# Patient Record
Sex: Male | Born: 1950 | Race: White | Hispanic: No | Marital: Married | Smoking: Former smoker
Health system: Southern US, Community
[De-identification: ages and names within clinical notes are randomized; demographics above are authoritative.]

## PROBLEM LIST (undated history)

## (undated) DIAGNOSIS — I272 Pulmonary hypertension, unspecified: Secondary | ICD-10-CM

## (undated) DIAGNOSIS — Z95810 Presence of automatic (implantable) cardiac defibrillator: Secondary | ICD-10-CM

## (undated) DIAGNOSIS — H348322 Tributary (branch) retinal vein occlusion, left eye, stable: Secondary | ICD-10-CM

## (undated) DIAGNOSIS — N138 Other obstructive and reflux uropathy: Secondary | ICD-10-CM

## (undated) DIAGNOSIS — G8929 Other chronic pain: Secondary | ICD-10-CM

## (undated) DIAGNOSIS — I2510 Atherosclerotic heart disease of native coronary artery without angina pectoris: Secondary | ICD-10-CM

## (undated) DIAGNOSIS — M549 Dorsalgia, unspecified: Principal | ICD-10-CM

## (undated) DIAGNOSIS — N401 Enlarged prostate with lower urinary tract symptoms: Secondary | ICD-10-CM

## (undated) DIAGNOSIS — G4733 Obstructive sleep apnea (adult) (pediatric): Secondary | ICD-10-CM

## (undated) DIAGNOSIS — I4510 Unspecified right bundle-branch block: Secondary | ICD-10-CM

## (undated) DIAGNOSIS — I10 Essential (primary) hypertension: Secondary | ICD-10-CM

## (undated) DIAGNOSIS — Z950 Presence of cardiac pacemaker: Secondary | ICD-10-CM

## (undated) DIAGNOSIS — E785 Hyperlipidemia, unspecified: Secondary | ICD-10-CM

## (undated) DIAGNOSIS — G629 Polyneuropathy, unspecified: Secondary | ICD-10-CM

## (undated) DIAGNOSIS — I5020 Unspecified systolic (congestive) heart failure: Secondary | ICD-10-CM

## (undated) DIAGNOSIS — G2581 Restless legs syndrome: Secondary | ICD-10-CM

## (undated) DIAGNOSIS — F17200 Nicotine dependence, unspecified, uncomplicated: Secondary | ICD-10-CM

## (undated) DIAGNOSIS — G4700 Insomnia, unspecified: Secondary | ICD-10-CM

## (undated) DIAGNOSIS — D62 Acute posthemorrhagic anemia: Secondary | ICD-10-CM

## (undated) DIAGNOSIS — Z8669 Personal history of other diseases of the nervous system and sense organs: Secondary | ICD-10-CM

## (undated) DIAGNOSIS — I480 Paroxysmal atrial fibrillation: Secondary | ICD-10-CM

## (undated) DIAGNOSIS — K922 Gastrointestinal hemorrhage, unspecified: Secondary | ICD-10-CM

## (undated) DIAGNOSIS — K279 Peptic ulcer, site unspecified, unspecified as acute or chronic, without hemorrhage or perforation: Secondary | ICD-10-CM

## (undated) DIAGNOSIS — F1190 Opioid use, unspecified, uncomplicated: Secondary | ICD-10-CM

## (undated) HISTORY — DX: Opioid use, unspecified, uncomplicated: F11.90

## (undated) HISTORY — DX: Other chronic pain: G89.29

## (undated) HISTORY — DX: Restless legs syndrome: G25.81

## (undated) HISTORY — DX: Personal history of other diseases of the nervous system and sense organs: Z86.69

## (undated) HISTORY — DX: Obstructive sleep apnea (adult) (pediatric): G47.33

## (undated) HISTORY — DX: Insomnia, unspecified: G47.00

## (undated) HISTORY — DX: Other obstructive and reflux uropathy: N40.1

## (undated) HISTORY — DX: Tributary (branch) retinal vein occlusion, left eye, stable: H34.8322

## (undated) HISTORY — DX: Atherosclerotic heart disease of native coronary artery without angina pectoris: I25.10

## (undated) HISTORY — DX: Gastrointestinal hemorrhage, unspecified: K92.2

## (undated) HISTORY — DX: Nicotine dependence, unspecified, uncomplicated: F17.200

## (undated) HISTORY — DX: Unspecified systolic (congestive) heart failure: I50.20

## (undated) HISTORY — DX: Essential (primary) hypertension: I10

## (undated) HISTORY — DX: Presence of automatic (implantable) cardiac defibrillator: Z95.810

## (undated) HISTORY — PX: TONSILLECTOMY: SUR1361

## (undated) HISTORY — DX: Polyneuropathy, unspecified: G62.9

## (undated) HISTORY — DX: Presence of cardiac pacemaker: Z95.0

## (undated) HISTORY — DX: Pulmonary hypertension, unspecified: I27.20

## (undated) HISTORY — DX: Acute posthemorrhagic anemia: D62

## (undated) HISTORY — DX: Unspecified right bundle-branch block: I45.10

## (undated) HISTORY — DX: Paroxysmal atrial fibrillation: I48.0

## (undated) HISTORY — DX: Dorsalgia, unspecified: M54.9

## (undated) HISTORY — DX: Peptic ulcer, site unspecified, unspecified as acute or chronic, without hemorrhage or perforation: K27.9

## (undated) HISTORY — DX: Benign prostatic hyperplasia with lower urinary tract symptoms: N13.8

## (undated) HISTORY — DX: Hyperlipidemia, unspecified: E78.5

---

## 1999-04-02 ENCOUNTER — Ambulatory Visit (HOSPITAL_COMMUNITY): Admission: RE | Admit: 1999-04-02 | Discharge: 1999-04-02 | Payer: Self-pay | Admitting: Podiatry

## 1999-04-02 ENCOUNTER — Encounter: Payer: Self-pay | Admitting: Podiatry

## 2000-04-08 ENCOUNTER — Ambulatory Visit (HOSPITAL_COMMUNITY): Admission: RE | Admit: 2000-04-08 | Discharge: 2000-04-08 | Payer: Self-pay | Admitting: Orthopedic Surgery

## 2000-04-09 ENCOUNTER — Encounter: Payer: Self-pay | Admitting: Orthopedic Surgery

## 2000-06-15 DIAGNOSIS — I4510 Unspecified right bundle-branch block: Secondary | ICD-10-CM

## 2000-06-15 HISTORY — PX: BACK SURGERY: SHX140

## 2000-06-15 HISTORY — PX: CARDIOVASCULAR STRESS TEST: SHX262

## 2000-06-15 HISTORY — DX: Unspecified right bundle-branch block: I45.10

## 2001-01-13 ENCOUNTER — Encounter: Admission: RE | Admit: 2001-01-13 | Discharge: 2001-01-13 | Payer: Self-pay | Admitting: Surgery

## 2001-01-13 ENCOUNTER — Encounter: Payer: Self-pay | Admitting: Surgery

## 2001-01-14 ENCOUNTER — Ambulatory Visit (HOSPITAL_BASED_OUTPATIENT_CLINIC_OR_DEPARTMENT_OTHER): Admission: RE | Admit: 2001-01-14 | Discharge: 2001-01-14 | Payer: Self-pay | Admitting: Surgery

## 2001-04-15 ENCOUNTER — Encounter: Payer: Self-pay | Admitting: Specialist

## 2001-04-15 ENCOUNTER — Encounter: Admission: RE | Admit: 2001-04-15 | Discharge: 2001-04-15 | Payer: Self-pay | Admitting: Specialist

## 2001-06-29 ENCOUNTER — Inpatient Hospital Stay (HOSPITAL_COMMUNITY): Admission: RE | Admit: 2001-06-29 | Discharge: 2001-07-03 | Payer: Self-pay | Admitting: Specialist

## 2001-06-29 ENCOUNTER — Encounter: Payer: Self-pay | Admitting: Specialist

## 2003-03-19 ENCOUNTER — Encounter: Payer: Self-pay | Admitting: Surgery

## 2003-03-19 ENCOUNTER — Encounter: Admission: RE | Admit: 2003-03-19 | Discharge: 2003-03-19 | Payer: Self-pay | Admitting: Surgery

## 2003-03-20 ENCOUNTER — Ambulatory Visit (HOSPITAL_BASED_OUTPATIENT_CLINIC_OR_DEPARTMENT_OTHER): Admission: RE | Admit: 2003-03-20 | Discharge: 2003-03-20 | Payer: Self-pay | Admitting: Surgery

## 2003-03-20 ENCOUNTER — Ambulatory Visit (HOSPITAL_COMMUNITY): Admission: RE | Admit: 2003-03-20 | Discharge: 2003-03-20 | Payer: Self-pay | Admitting: Surgery

## 2003-06-16 HISTORY — PX: HERNIA REPAIR: SHX51

## 2004-05-26 ENCOUNTER — Ambulatory Visit: Payer: Self-pay | Admitting: Family Medicine

## 2004-11-25 ENCOUNTER — Ambulatory Visit: Payer: Self-pay | Admitting: Family Medicine

## 2005-05-26 ENCOUNTER — Ambulatory Visit: Payer: Self-pay | Admitting: Family Medicine

## 2005-07-03 ENCOUNTER — Ambulatory Visit: Payer: Self-pay | Admitting: Family Medicine

## 2005-11-23 ENCOUNTER — Ambulatory Visit: Payer: Self-pay | Admitting: Family Medicine

## 2006-06-15 HISTORY — PX: KNEE SURGERY: SHX244

## 2011-03-24 LAB — COMPREHENSIVE METABOLIC PANEL
AST: 18 U/L
Albumin: 4.4
BUN: 9 mg/dL (ref 4–21)
Calcium: 9.5 mg/dL
Creat: 0.7
Total Protein: 6.7 g/dL

## 2011-03-24 LAB — LIPID PANEL
Cholesterol, Total: 196
Triglycerides: 253.7

## 2011-03-24 LAB — CBC
Hemoglobin: 18 g/dL — AB (ref 13.5–17.5)
platelet count: 193

## 2011-08-24 ENCOUNTER — Encounter: Payer: Self-pay | Admitting: Family Medicine

## 2011-08-24 ENCOUNTER — Ambulatory Visit (INDEPENDENT_AMBULATORY_CARE_PROVIDER_SITE_OTHER): Payer: Medicare Other | Admitting: Family Medicine

## 2011-08-24 VITALS — BP 142/84 | HR 80 | Temp 97.9°F | Ht 68.0 in | Wt 289.8 lb

## 2011-08-24 DIAGNOSIS — G2581 Restless legs syndrome: Secondary | ICD-10-CM

## 2011-08-24 DIAGNOSIS — N138 Other obstructive and reflux uropathy: Secondary | ICD-10-CM

## 2011-08-24 DIAGNOSIS — Z8679 Personal history of other diseases of the circulatory system: Secondary | ICD-10-CM

## 2011-08-24 DIAGNOSIS — G8929 Other chronic pain: Secondary | ICD-10-CM

## 2011-08-24 DIAGNOSIS — M549 Dorsalgia, unspecified: Secondary | ICD-10-CM

## 2011-08-24 DIAGNOSIS — G4733 Obstructive sleep apnea (adult) (pediatric): Secondary | ICD-10-CM

## 2011-08-24 DIAGNOSIS — N139 Obstructive and reflux uropathy, unspecified: Secondary | ICD-10-CM

## 2011-08-24 DIAGNOSIS — E785 Hyperlipidemia, unspecified: Secondary | ICD-10-CM

## 2011-08-24 DIAGNOSIS — N401 Enlarged prostate with lower urinary tract symptoms: Secondary | ICD-10-CM

## 2011-08-24 DIAGNOSIS — G4700 Insomnia, unspecified: Secondary | ICD-10-CM

## 2011-08-24 DIAGNOSIS — G629 Polyneuropathy, unspecified: Secondary | ICD-10-CM

## 2011-08-24 DIAGNOSIS — I10 Essential (primary) hypertension: Secondary | ICD-10-CM

## 2011-08-24 MED ORDER — MORPHINE SULFATE CR 60 MG PO TB12: 60.0000 mg | ORAL_TABLET | Freq: Two times a day (BID) | ORAL | Status: AC

## 2011-08-24 MED ORDER — MORPHINE SULFATE CR 60 MG PO TB12: 60.0000 mg | ORAL_TABLET | Freq: Two times a day (BID) | ORAL | Status: DC

## 2011-08-24 NOTE — Progress Notes (Signed)
Subjective:    Patient ID: Patrick Garner, male    DOB: 1951/05/15, 61 y.o.   MRN: [id number]  HPI CC: new pt establish  Prior saw Dr. Hetty Ely then Dr. Royal Hawthorn then moved back here so would like to re establish.  Lives in [place].  Commutes here for medicines.  States no doc in [place] is willing to prescribe morphine.  No questions or concerns today. Presents to get established.  H/o chronic back pain - since back surgery, on disability for this.  takes morphine long acting bid.  Takes oxycodone for breakthrough pain.  Rarely uses oxycodone 10mg .  Requests 3 mo at a time.  Verified med dosing based on records from Dr. Royal Hawthorn.  H/o BPH - takes finasteride and flomax.  Feels stable.  Preventative: Last CPE was late last year 03/24/2011. Tetanus 2008. No flu shot this year. No colonoscopy.  Medications and allergies reviewed and updated in chart.  Past histories reviewed and updated if relevant as below. There is no problem list on file for this patient.  Past Medical History  Diagnosis Date  . HTN (hypertension)   . HLD (hyperlipidemia)   . Allergic rhinitis   . Chronic back pain     h/o spinal fusion with rods/screw, on morphine/gabapentin/oxycodone  . RLS (restless legs syndrome)   . Peripheral neuropathy   . OSA (obstructive sleep apnea)     refuses sleep study  . Lateral knee pain   . Insomnia   . BPH (benign prostatic hypertrophy) with urinary obstruction   . History of Bell's palsy   . Chronic narcotic use   . History of coronary artery disease     s/p MI remotely   Past Surgical History  Procedure Date  . Knee surgery 2008  . Back surgery 2002  . Hernia repair 2005  . Tonsillectomy 1950's   History  Substance Use Topics  . Smoking status: Current Everyday Smoker -- 0.8 packs/day    Types: Cigarettes  . Smokeless tobacco: Never Used  . Alcohol Use: No     Rare   Family History  Problem Relation Age of Onset  . Heart disease Father   . Diabetes Paternal  Aunt   . Diabetes Paternal Uncle   . Coronary artery disease Neg Hx   . Stroke Neg Hx   . Cancer Neg Hx    No Known Allergies No current outpatient prescriptions on file prior to visit.     Review of Systems  Constitutional: Negative for fever, chills, activity change, appetite change, fatigue and unexpected weight change.  HENT: Negative for hearing loss and neck pain.   Eyes: Negative for visual disturbance.  Respiratory: Negative for cough, chest tightness, shortness of breath and wheezing.   Cardiovascular: Negative for chest pain, palpitations and leg swelling.  Gastrointestinal: Negative for nausea, vomiting, abdominal pain, diarrhea, constipation, blood in stool and abdominal distention.  Genitourinary: Negative for hematuria and difficulty urinating.  Musculoskeletal: Negative for myalgias and arthralgias.  Skin: Negative for rash.  Neurological: Negative for dizziness, seizures, syncope and headaches.  Hematological: Does not bruise/bleed easily.  Psychiatric/Behavioral: Negative for dysphoric mood. The patient is not nervous/anxious.        Objective:   Physical Exam  Nursing note and vitals reviewed. Constitutional: He is oriented to person, place, and time. He appears well-developed and well-nourished. No distress.       obese  HENT:  Head: Normocephalic and atraumatic.  Right Ear: External ear normal.  Left Ear: External  ear normal.  Nose: Nose normal.  Mouth/Throat: Oropharynx is clear and moist. No oropharyngeal exudate.  Eyes: Conjunctivae and EOM are normal. Pupils are equal, round, and reactive to light. No scleral icterus.  Neck: Normal range of motion. Neck supple. Carotid bruit is not present. No thyromegaly present.  Cardiovascular: Normal rate, regular rhythm, normal heart sounds and intact distal pulses.   No murmur heard. Pulses:      Radial pulses are 2+ on the right side, and 2+ on the left side.  Pulmonary/Chest: Effort normal and breath sounds  normal. No respiratory distress. He has no wheezes. He has no rales.  Abdominal: Soft. Bowel sounds are normal. He exhibits no distension and no mass. There is no tenderness. There is no rebound and no guarding.  Musculoskeletal: Normal range of motion. He exhibits edema (tr pedal edema).  Lymphadenopathy:    He has no cervical adenopathy.  Neurological: He is alert and oriented to person, place, and time.       CN grossly intact, station and gait intact  Skin: Skin is warm and dry. No rash noted.  Psychiatric: He has a normal mood and affect. His behavior is normal. Judgment and thought content normal.       Assessment & Plan:

## 2011-08-24 NOTE — Patient Instructions (Signed)
Good to see you today, return in 3 months for follow up I have provided you with 3 month supply of morphine.  Call us when other refills are due. Sign controlled substance agreement. Call us with questions in the meantime.

## 2011-08-25 ENCOUNTER — Encounter: Payer: Self-pay | Admitting: Family Medicine

## 2011-08-25 DIAGNOSIS — G4700 Insomnia, unspecified: Secondary | ICD-10-CM | POA: Insufficient documentation

## 2011-08-25 DIAGNOSIS — I10 Essential (primary) hypertension: Secondary | ICD-10-CM | POA: Insufficient documentation

## 2011-08-25 DIAGNOSIS — G8929 Other chronic pain: Secondary | ICD-10-CM | POA: Insufficient documentation

## 2011-08-25 DIAGNOSIS — G4733 Obstructive sleep apnea (adult) (pediatric): Secondary | ICD-10-CM | POA: Insufficient documentation

## 2011-08-25 DIAGNOSIS — N401 Enlarged prostate with lower urinary tract symptoms: Secondary | ICD-10-CM | POA: Insufficient documentation

## 2011-08-25 DIAGNOSIS — G629 Polyneuropathy, unspecified: Secondary | ICD-10-CM | POA: Insufficient documentation

## 2011-08-25 DIAGNOSIS — N138 Other obstructive and reflux uropathy: Secondary | ICD-10-CM | POA: Insufficient documentation

## 2011-08-25 DIAGNOSIS — E785 Hyperlipidemia, unspecified: Secondary | ICD-10-CM | POA: Insufficient documentation

## 2011-08-25 DIAGNOSIS — G2581 Restless legs syndrome: Secondary | ICD-10-CM | POA: Insufficient documentation

## 2011-08-25 NOTE — Assessment & Plan Note (Signed)
Stable, chronic. Continue finasteride and flomax.

## 2011-08-25 NOTE — Assessment & Plan Note (Signed)
BP: 142/84 mmHg  Stable. Continue meds for now.

## 2011-08-25 NOTE — Assessment & Plan Note (Signed)
Have asked to scan chol into chart.

## 2011-08-25 NOTE — Assessment & Plan Note (Signed)
Verified med dosing.  Refilled x 3 mo at a time as commutes to [facility]. Anticipate healthiest thing down road is for pt to establish with provider locally. Pt signed controlled substance agreement.

## 2011-08-25 NOTE — Assessment & Plan Note (Signed)
Pt declines to pursue further evaluation.

## 2011-10-08 ENCOUNTER — Other Ambulatory Visit: Payer: Self-pay

## 2011-10-08 MED ORDER — FINASTERIDE 5 MG PO TABS: 5.0000 mg | ORAL_TABLET | Freq: Every day | ORAL | Status: DC

## 2011-10-08 MED ORDER — OXYCODONE HCL 10 MG PO TABS: 10.0000 mg | ORAL_TABLET | Freq: Two times a day (BID) | ORAL | Status: DC | PRN

## 2011-10-08 MED ORDER — ATORVASTATIN CALCIUM 40 MG PO TABS: 40.0000 mg | ORAL_TABLET | Freq: Every day | ORAL | Status: DC

## 2011-10-08 NOTE — Telephone Encounter (Signed)
plz notify ready for pick up. 

## 2011-10-08 NOTE — Telephone Encounter (Signed)
Pt request written rxs for Atorvastatin 40 mg, finasteride 5 mg and oxycodone 10mg . Pt will be coming to our area from [place] on 10/09/11. Pt last seen 08/24/11. Pt has appt scheduled with Dr Reece Agar 11/24/11.Please call pt [phone] when rx ready for pick up. Pt said would like call back today to make sure rx will be ready on Frid. Told pt nurse would call when ready for pick up and pt said oK.

## 2011-10-09 NOTE — Telephone Encounter (Signed)
Patient aware and has already picked up Rx's.

## 2011-11-24 ENCOUNTER — Encounter: Payer: Self-pay | Admitting: Family Medicine

## 2011-11-24 ENCOUNTER — Ambulatory Visit (INDEPENDENT_AMBULATORY_CARE_PROVIDER_SITE_OTHER): Payer: Medicare Other | Admitting: Family Medicine

## 2011-11-24 VITALS — BP 130/88 | HR 80 | Temp 97.9°F | Ht 68.0 in | Wt 290.5 lb

## 2011-11-24 DIAGNOSIS — M549 Dorsalgia, unspecified: Secondary | ICD-10-CM

## 2011-11-24 DIAGNOSIS — Z87891 Personal history of nicotine dependence: Secondary | ICD-10-CM | POA: Insufficient documentation

## 2011-11-24 DIAGNOSIS — E785 Hyperlipidemia, unspecified: Secondary | ICD-10-CM

## 2011-11-24 DIAGNOSIS — I10 Essential (primary) hypertension: Secondary | ICD-10-CM

## 2011-11-24 DIAGNOSIS — F17200 Nicotine dependence, unspecified, uncomplicated: Secondary | ICD-10-CM

## 2011-11-24 DIAGNOSIS — Z720 Tobacco use: Secondary | ICD-10-CM

## 2011-11-24 DIAGNOSIS — G8929 Other chronic pain: Secondary | ICD-10-CM

## 2011-11-24 LAB — COMPREHENSIVE METABOLIC PANEL
AST: 17 U/L (ref 0–37)
Albumin: 3.8 g/dL (ref 3.5–5.2)
Alkaline Phosphatase: 104 U/L (ref 39–117)
Potassium: 4.2 mEq/L (ref 3.5–5.1)
Sodium: 145 mEq/L (ref 135–145)
Total Protein: 6 g/dL (ref 6.0–8.3)

## 2011-11-24 LAB — LIPID PANEL
LDL Cholesterol: 60 mg/dL (ref 0–99)
VLDL: 22.6 mg/dL (ref 0.0–40.0)

## 2011-11-24 MED ORDER — OXYCODONE HCL 10 MG PO TABS: 10.0000 mg | ORAL_TABLET | Freq: Two times a day (BID) | ORAL | Status: DC | PRN

## 2011-11-24 MED ORDER — MORPHINE SULFATE ER 60 MG PO TBCR: 60.0000 mg | EXTENDED_RELEASE_TABLET | Freq: Two times a day (BID) | ORAL | Status: DC

## 2011-11-24 MED ORDER — OXYCODONE HCL 10 MG PO TABS: 10.0000 mg | ORAL_TABLET | Freq: Two times a day (BID) | ORAL | Status: DC

## 2011-11-24 NOTE — Assessment & Plan Note (Signed)
Contemplative.  Using e-cig. Encouraged cessation.

## 2011-11-24 NOTE — Progress Notes (Signed)
Subjective:    Patient ID: Patrick Garner, male    DOB: 07/15/1950, 61 y.o.   MRN: [id number]  HPI CC: 3 mo f/u  chronic back pain - takes MS contin 60mg  bid as well as oxycodone for breakthrough pain.  During summer, takes about 4-5 pills monthly.  Open to decreasing dose of pain meds.  HTN - No vision changes, CP/tightness, SOB, leg swelling.  Last vision screen was 5 yrs ago.  Due for this.  Usually left leg swells more than right.  HLD - tolerant of lipitor.  No myalgias.  Smoking - <1 ppd.  Bought e-cigarette.  Contemplative.  Fasting today.  No recent tetanus.  Possibly 6 yrs ago.  May give at physical.  Past Medical History  Diagnosis Date  . HTN (hypertension)   . HLD (hyperlipidemia)   . Allergic rhinitis   . Chronic back pain     h/o spinal fusion with rods/screw, on morphine/gabapentin/oxycodone  . RLS (restless legs syndrome)   . Peripheral neuropathy   . OSA (obstructive sleep apnea)     refuses sleep eval  . Lateral knee pain   . Insomnia   . BPH (benign prostatic hypertrophy) with urinary obstruction   . History of Bell's palsy   . Chronic narcotic use   . History of coronary artery disease     s/p MI remotely  . Smoker   . PUD (peptic ulcer disease) 1980s    via EGD  . RBBB    Current Outpatient Prescriptions on File Prior to Visit  Medication Sig Dispense Refill  . aspirin 325 MG tablet Take 325 mg by mouth daily.      Marland Kitchen atorvastatin (LIPITOR) 40 MG tablet Take 1 tablet (40 mg total) by mouth at bedtime.  90 tablet  3  . finasteride (PROSCAR) 5 MG tablet Take 1 tablet (5 mg total) by mouth daily.  90 tablet  3  . fish oil-omega-3 fatty acids 1000 MG capsule Take 1 g by mouth 3 (three) times daily.      . fluticasone (FLONASE) 50 MCG/ACT nasal spray Place 2 sprays into the nose daily.      Marland Kitchen gabapentin (NEURONTIN) 800 MG tablet Take 800 mg by mouth 3 (three) times daily.      Marland Kitchen lisinopril (PRINIVIL,ZESTRIL) 40 MG tablet Take 40 mg by mouth daily.       . potassium chloride SA (K-DUR,KLOR-CON) 20 MEQ tablet Take 20 mEq by mouth daily.       . Tamsulosin HCl (FLOMAX) 0.4 MG CAPS Take 0.4 mg by mouth daily.      Marland Kitchen zolpidem (AMBIEN) 10 MG tablet Take 20 mg by mouth at bedtime as needed.        Review of Systems Per HPI    Objective:   Physical Exam  Nursing note and vitals reviewed. Constitutional: He appears well-developed and well-nourished. No distress.       obese  HENT:  Head: Normocephalic and atraumatic.  Mouth/Throat: Oropharynx is clear and moist. No oropharyngeal exudate.  Eyes: Conjunctivae and EOM are normal. Pupils are equal, round, and reactive to light. No scleral icterus.  Neck: Normal range of motion. Neck supple. Carotid bruit is not present.  Cardiovascular: Normal rate, regular rhythm, normal heart sounds and intact distal pulses.   No murmur heard. Pulmonary/Chest: Effort normal and breath sounds normal. No respiratory distress. He has no wheezes. He has no rales.  Musculoskeletal: He exhibits no edema.  Skin: Skin is warm and  dry. No rash noted.  Psychiatric: He has a normal mood and affect.       Assessment & Plan:

## 2011-11-24 NOTE — Assessment & Plan Note (Signed)
Refilled MS Contin and oxycodone for 3 mo supply. Only uses 4-5 oxycodone pills a month recently, decreased # from 60 to 30. Discussed decreasing dose of MS contin - to minimize narcotics - pt open to this, will try next visit. Has signed controlled substance agreement.

## 2011-11-24 NOTE — Assessment & Plan Note (Signed)
BP Readings from Last 3 Encounters:  11/24/11 130/88  08/24/11 142/84  chronic, stable.  Continue med.

## 2011-11-24 NOTE — Patient Instructions (Signed)
Blood work today. Return in 3 months for follow up.  We may try and back down on morphine at that visit. Good to see you today, call us with questions.

## 2011-11-24 NOTE — Assessment & Plan Note (Signed)
Tolerating statin. Check FLP today. 

## 2012-02-24 ENCOUNTER — Encounter: Payer: Self-pay | Admitting: Family Medicine

## 2012-02-24 ENCOUNTER — Ambulatory Visit (INDEPENDENT_AMBULATORY_CARE_PROVIDER_SITE_OTHER): Payer: Medicare Other | Admitting: Family Medicine

## 2012-02-24 VITALS — BP 134/88 | HR 64 | Temp 97.5°F | Wt 283.0 lb

## 2012-02-24 DIAGNOSIS — M549 Dorsalgia, unspecified: Secondary | ICD-10-CM

## 2012-02-24 DIAGNOSIS — Z23 Encounter for immunization: Secondary | ICD-10-CM

## 2012-02-24 DIAGNOSIS — G8929 Other chronic pain: Secondary | ICD-10-CM

## 2012-02-24 MED ORDER — ZOLPIDEM TARTRATE 10 MG PO TABS: 20.0000 mg | ORAL_TABLET | Freq: Every evening | ORAL | Status: DC | PRN

## 2012-02-24 MED ORDER — IBUPROFEN 600 MG PO TABS: 600.0000 mg | ORAL_TABLET | Freq: Two times a day (BID) | ORAL | Status: DC | PRN

## 2012-02-24 MED ORDER — FINASTERIDE 5 MG PO TABS: 5.0000 mg | ORAL_TABLET | Freq: Every day | ORAL | Status: DC

## 2012-02-24 MED ORDER — TRAMADOL HCL 50 MG PO TABS: 50.0000 mg | ORAL_TABLET | Freq: Two times a day (BID) | ORAL | Status: DC | PRN

## 2012-02-24 MED ORDER — MORPHINE SULFATE ER 30 MG PO TBCR: 30.0000 mg | EXTENDED_RELEASE_TABLET | Freq: Two times a day (BID) | ORAL | Status: DC

## 2012-02-24 MED ORDER — FLUTICASONE PROPIONATE 50 MCG/ACT NA SUSP: 2.0000 | Freq: Every day | NASAL | Status: DC

## 2012-02-24 MED ORDER — TAMSULOSIN HCL 0.4 MG PO CAPS: 0.4000 mg | ORAL_CAPSULE | Freq: Every day | ORAL | Status: DC

## 2012-02-24 MED ORDER — LISINOPRIL 40 MG PO TABS: 40.0000 mg | ORAL_TABLET | Freq: Every day | ORAL | Status: DC

## 2012-02-24 MED ORDER — GABAPENTIN 800 MG PO TABS: 800.0000 mg | ORAL_TABLET | Freq: Three times a day (TID) | ORAL | Status: DC

## 2012-02-24 MED ORDER — TRAMADOL HCL 50 MG PO TABS: 50.0000 mg | ORAL_TABLET | Freq: Two times a day (BID) | ORAL | Status: AC | PRN

## 2012-02-24 NOTE — Assessment & Plan Note (Addendum)
Off oxycodone. Discussed decreasing MS contin - pt hopeful to be able to minimize use of narcotics. Goal for next visit will be decreased MS contin use by 50%.   Start by taking 60mg  in am and 30mg  in pm for 1 month, then decrease to 30mg  bid. Prescribed tramadol for interim pain and ibuprofen for breakthrough pain despite tramadol. Discussed sxs of narcotic withdrawal and to notify me if any evidence of this. If not doing well with this, discussed need to return sooner, pt states would be able to do this. Pt tends to do well in summer, worse pain in winter - so will need to closely monitor this. discussed precautions with use of ibuprofen and h/o CVD, h/o PUD.  No known CKD.  Lab Results  Component Value Date   CREATININE 0.9 11/24/2011

## 2012-02-24 NOTE — Patient Instructions (Addendum)
I'm glad we were able to stop oxycodone. Let's continue MS contin but at lower dose - take 60mg  in morning and 30mg  in evening for 1 month.  Then may decrease to 30mg  twice daily May take tramadol twice daily as needed for pain - script with 3 refills printed out. May take ibuprofen for breakthrough pain, but try to minimize given it can affect heart, kidneys and stomach. Good to see you today, return in 3 months for medicare physical.

## 2012-02-24 NOTE — Progress Notes (Signed)
Subjective:    Patient ID: Patrick Garner, male    DOB: 06/06/1951, 61 y.o.   MRN: [id number]  HPI CC: f/u pain meds.  Was scheduled for medicare wellness form, but actually not due until next month.  Will return for this at next visit in december.  H/o chronic back pain s/p surgery - takes MS contin 60mg  bid as well as was taking oxycodone for breakthrough pain.  Actually weaned himself off oxycodone - changed to ibuprofen, taking 800mg  prn once or twice a day, when in pain.  Finds that ibupofen works just as well.  Very open to decreasing MS Contin dose.    Has been compliant with narcotics, has not requested early refills.  Initially was on oxycodone, but this was changed by prior provider to morphine 2/2 cheaper cost.  O/w doing well.  Tolerating all his meds.  Requests refills which have been provided.  Requests handicap placard renewal, uses cane, cannot walk prolonged periods due to orthopedic condition.  Medications and allergies reviewed and updated in chart.  Past histories reviewed and updated if relevant as below. Patient Active Problem List  Diagnosis  . HTN (hypertension)  . HLD (hyperlipidemia)  . Chronic back pain  . RLS (restless legs syndrome)  . Peripheral neuropathy  . OSA (obstructive sleep apnea)  . Insomnia  . BPH (benign prostatic hypertrophy) with urinary obstruction  . History of coronary artery disease  . Tobacco abuse   Past Medical History  Diagnosis Date  . HTN (hypertension)   . HLD (hyperlipidemia)   . Allergic rhinitis   . Chronic back pain     h/o spinal fusion with rods/screw, on morphine/gabapentin/oxycodone  . RLS (restless legs syndrome)   . Peripheral neuropathy   . OSA (obstructive sleep apnea)     refuses sleep eval  . Lateral knee pain   . Insomnia   . BPH (benign prostatic hypertrophy) with urinary obstruction   . History of Bell's palsy   . Chronic narcotic use   . History of coronary artery disease     s/p MI remotely    . Smoker   . PUD (peptic ulcer disease) 1980s    via EGD  . RBBB    Past Surgical History  Procedure Date  . Knee surgery 2008  . Back surgery 2002  . Hernia repair 2005  . Tonsillectomy 1950's  . 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 Smoking status: Current Every Day Smoker -- 0.8 packs/day    Types: Cigarettes  . Smokeless tobacco: Never Used  . Alcohol Use: No   Family History  Problem Relation Age of Onset  . Heart disease Father   . Diabetes Paternal Aunt   . Diabetes Paternal Uncle   . Coronary artery disease Neg Hx   . Stroke Neg Hx   . Cancer Neg Hx    No Known Allergies Current Outpatient Prescriptions on File Prior to Visit  Medication Sig Dispense Refill  . aspirin 325 MG tablet Take 325 mg by mouth daily.      Marland Kitchen atorvastatin (LIPITOR) 40 MG tablet Take 1 tablet (40 mg total) by mouth at bedtime.  90 tablet  3  . finasteride (PROSCAR) 5 MG tablet Take 1 tablet (5 mg total) by mouth daily.  90 tablet  3  . fish oil-omega-3 fatty acids 1000 MG capsule Take 1 g by mouth 3 (  three) times daily.      . fluticasone (FLONASE) 50 MCG/ACT nasal spray Place 2 sprays into the nose daily.      Marland Kitchen gabapentin (NEURONTIN) 800 MG tablet Take 800 mg by mouth 3 (three) times daily.      Marland Kitchen lisinopril (PRINIVIL,ZESTRIL) 40 MG tablet Take 40 mg by mouth daily.      Marland Kitchen morphine (MS CONTIN) 60 MG 12 hr tablet Take 60 mg by mouth 2 (two) times daily. Fill after 02/07/2012      . potassium chloride SA (K-DUR,KLOR-CON) 20 MEQ tablet Take 20 mEq by mouth daily.       . Tamsulosin HCl (FLOMAX) 0.4 MG CAPS Take 0.4 mg by mouth daily.      Marland Kitchen zolpidem (AMBIEN) 10 MG tablet Take 20 mg by mouth at bedtime as needed.         Review of Systems Per HPI    Objective:   Physical Exam  Nursing note and vitals reviewed. Constitutional: He appears well-developed and well-nourished. No distress.        obese  HENT:  Head: Normocephalic and atraumatic.  Mouth/Throat: Oropharynx is clear and moist. No oropharyngeal exudate.  Eyes: Conjunctivae normal and EOM are normal. Pupils are equal, round, and reactive to light. No scleral icterus.       Small pupils  Neck: Normal range of motion. Neck supple. Carotid bruit is not present.  Cardiovascular: Normal rate, regular rhythm, normal heart sounds and intact distal pulses.   No murmur heard. Pulmonary/Chest: Effort normal and breath sounds normal. No respiratory distress. He has no wheezes. He has no rales.  Musculoskeletal: He exhibits no edema.  Skin: Skin is warm and dry. No rash noted.  Psychiatric: He has a normal mood and affect.       Assessment & Plan:  Flu shot today. Asked him to bring medicare wellness visit questionairre that he has already filled out to next visit.

## 2012-03-07 ENCOUNTER — Encounter: Payer: Self-pay | Admitting: Family Medicine

## 2012-05-24 ENCOUNTER — Encounter: Payer: Self-pay | Admitting: Family Medicine

## 2012-05-24 ENCOUNTER — Encounter: Payer: Self-pay | Admitting: *Deleted

## 2012-05-24 ENCOUNTER — Ambulatory Visit (INDEPENDENT_AMBULATORY_CARE_PROVIDER_SITE_OTHER): Payer: Medicare HMO | Admitting: Family Medicine

## 2012-05-24 VITALS — BP 136/96 | HR 84 | Temp 98.1°F | Ht 69.0 in | Wt 286.0 lb

## 2012-05-24 DIAGNOSIS — E785 Hyperlipidemia, unspecified: Secondary | ICD-10-CM

## 2012-05-24 DIAGNOSIS — Z1211 Encounter for screening for malignant neoplasm of colon: Secondary | ICD-10-CM

## 2012-05-24 DIAGNOSIS — N139 Obstructive and reflux uropathy, unspecified: Secondary | ICD-10-CM

## 2012-05-24 DIAGNOSIS — N138 Other obstructive and reflux uropathy: Secondary | ICD-10-CM

## 2012-05-24 DIAGNOSIS — N401 Enlarged prostate with lower urinary tract symptoms: Secondary | ICD-10-CM

## 2012-05-24 DIAGNOSIS — Z1331 Encounter for screening for depression: Secondary | ICD-10-CM

## 2012-05-24 DIAGNOSIS — Z23 Encounter for immunization: Secondary | ICD-10-CM

## 2012-05-24 DIAGNOSIS — Z0000 Encounter for general adult medical examination without abnormal findings: Secondary | ICD-10-CM | POA: Insufficient documentation

## 2012-05-24 DIAGNOSIS — Z8679 Personal history of other diseases of the circulatory system: Secondary | ICD-10-CM

## 2012-05-24 DIAGNOSIS — Z720 Tobacco use: Secondary | ICD-10-CM

## 2012-05-24 DIAGNOSIS — M549 Dorsalgia, unspecified: Secondary | ICD-10-CM

## 2012-05-24 DIAGNOSIS — I10 Essential (primary) hypertension: Secondary | ICD-10-CM

## 2012-05-24 DIAGNOSIS — F17200 Nicotine dependence, unspecified, uncomplicated: Secondary | ICD-10-CM

## 2012-05-24 DIAGNOSIS — G8929 Other chronic pain: Secondary | ICD-10-CM

## 2012-05-24 LAB — COMPREHENSIVE METABOLIC PANEL
AST: 21 U/L (ref 0–37)
Albumin: 4 g/dL (ref 3.5–5.2)
Alkaline Phosphatase: 122 U/L — ABNORMAL HIGH (ref 39–117)
BUN: 17 mg/dL (ref 6–23)
Potassium: 3.9 mEq/L (ref 3.5–5.1)
Sodium: 143 mEq/L (ref 135–145)

## 2012-05-24 LAB — LIPID PANEL
Cholesterol: 158 mg/dL (ref 0–200)
LDL Cholesterol: 91 mg/dL (ref 0–99)
VLDL: 22.6 mg/dL (ref 0.0–40.0)

## 2012-05-24 LAB — PSA: PSA: 0.2 ng/mL (ref 0.10–4.00)

## 2012-05-24 MED ORDER — MORPHINE SULFATE ER 30 MG PO TBCR: 30.0000 mg | EXTENDED_RELEASE_TABLET | Freq: Two times a day (BID) | ORAL | Status: DC

## 2012-05-24 MED ORDER — ATORVASTATIN CALCIUM 40 MG PO TABS: 40.0000 mg | ORAL_TABLET | Freq: Every day | ORAL | Status: DC

## 2012-05-24 MED ORDER — TRAMADOL HCL 50 MG PO TABS: 50.0000 mg | ORAL_TABLET | Freq: Two times a day (BID) | ORAL | Status: DC | PRN

## 2012-05-24 MED ORDER — MORPHINE SULFATE ER 30 MG PO TBCR
30.0000 mg | EXTENDED_RELEASE_TABLET | Freq: Two times a day (BID) | ORAL | Status: DC
Start: 2012-05-24 — End: 2012-08-24

## 2012-05-24 NOTE — Assessment & Plan Note (Signed)
Complaint and tolerating pain regimen well. Refilled 3 mo of ms contin 30mg  bid. Continue tramadol and ibuprofen prn.

## 2012-05-24 NOTE — Assessment & Plan Note (Signed)
Chronic, elevated today but overall adequate. BP Readings from Last 3 Encounters:  05/24/12 136/96  02/24/12 134/88  11/24/11 130/88

## 2012-05-24 NOTE — Addendum Note (Signed)
Addended by: Josph Macho A on: 05/24/2012 09:22 AM   Modules accepted: Orders

## 2012-05-24 NOTE — Assessment & Plan Note (Signed)
Check PSA today. 

## 2012-05-24 NOTE — Assessment & Plan Note (Signed)
I have personally reviewed the Medicare Annual Wellness questionnaire and have noted 1. The patient's medical and social history 2. Their use of alcohol, tobacco or illicit drugs 3. Their current medications and supplements 4. The patient's functional ability including ADL's, fall risks, home safety risks and hearing or visual impairment. 5. Diet and physical activity 6. Evidence for depression or mood disorders The patients weight, height, BMI have been recorded in the chart.  Hearing and vision has been addressed. I have made referrals, counseling and provided education to the patient based review of the above and I have provided the pt with a written personalized care plan for preventive services. See scanned questionairre. Advanced directives discussed: asked him to bring his advanced directive to next clinic visit.  Reviewed preventative protocols and updated unless pt declined. Declines prostate exam/prostate screening test.

## 2012-05-24 NOTE — Assessment & Plan Note (Signed)
Chronic, stable. Compliant with lipitor.  Check FLP today.

## 2012-05-24 NOTE — Progress Notes (Signed)
Subjective:    Patient ID: Patrick Garner, male    DOB: 1950/09/10, 61 y.o.   MRN: [id number]  HPI CC: medicare wellness visit  Had a nice thanksgiving.  No questions or concerns today.  Drove down from [place] today, traveling to [place] today. Wt Readings from Last 3 Encounters:  05/24/12 286 lb (129.729 kg)  02/24/12 283 lb (128.368 kg)  11/24/11 290 lb 8 oz (131.77 kg)    Recently decreased narcotic meds - was on ms contin 60mg  bid and oxycodone prn rarely for breakthrough pain.  Self weaned off oxycodone to ibuprofen 600mg  prn, states works just as well.  MS contin was decreased to 30mg  bid last visit, tramadol was prescribed for prn pain and ibuprofen for breakthrough pain (given h/o PUD and CAD).  Actually doing very well with pain management.  Smoking - down to <1 ppd.  Continues working on cessation.  Passes hearing and vision screens today. Denies sadness, depression, anhedonia. 1 fall in last year, did not injury himself.  Fall walking on uneven ground, when L knee gave out - h/o L TKR.  Uses cane to prevent falls.  Preventative:  Last CPE was late last year 03/24/2011. Tetanus 2008. Flu shot 02/2012 Pneumovax - smoker.  H/o CAD. Shingles shot - declines. Colon cancer screening - stool kit Prostate cancer screening - pt does not want further prostate exams. Declines screening this year.  H/o BPH with latest PSA 0.27 (03/2011).  Denies further sxs, flomax and proscar working well Advanced directives: has living will at home.  Asked to bring copy.  Caffeine: rare Lives with wife, 1 cat Live in [place], commutes for medical care.  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  Past histories reviewed and updated if relevant as below. Patient Active Problem List  Diagnosis  . HTN (hypertension)  . HLD (hyperlipidemia)  . Chronic back pain  .  RLS (restless legs syndrome)  . Peripheral neuropathy  . OSA (obstructive sleep apnea)  . Insomnia  . BPH (benign prostatic hypertrophy) with urinary obstruction  . History of coronary artery disease  . Tobacco abuse  . Medicare annual wellness visit, subsequent   Past Medical History  Diagnosis Date  . HTN (hypertension)   . HLD (hyperlipidemia)   . Allergic rhinitis   . Chronic back pain     h/o spinal fusion with rods/screw, on morphine/gabapentin  . RLS (restless legs syndrome)   . Peripheral neuropathy   . OSA (obstructive sleep apnea)     refuses sleep eval  . Lateral knee pain   . Insomnia   . BPH (benign prostatic hypertrophy) with urinary obstruction   . History of Bell's palsy   . Chronic narcotic use   . Smoker   . PUD (peptic ulcer disease) 1980s    via EGD  . RBBB 2002  . CAD (coronary artery disease) 2002    by stress test, remote MI   Past Surgical History  Procedure Date  . Knee surgery 2008  . Back surgery 2002  . Hernia repair 2005  . Tonsillectomy 1950's  . 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 Smoking status: Current Every Day Smoker -- 0.8 packs/day    Types: Cigarettes  . Smokeless tobacco: Never Used  . Alcohol  Use: No   Family History  Problem Relation Age of Onset  . Heart disease Father   . Diabetes Paternal Aunt   . Diabetes Paternal Uncle   . Coronary artery disease Neg Hx   . Stroke Neg Hx   . Cancer Neg Hx    No Known Allergies Current Outpatient Prescriptions on File Prior to Visit  Medication Sig Dispense Refill  . aspirin 325 MG tablet Take 325 mg by mouth daily.      . finasteride (PROSCAR) 5 MG tablet Take 1 tablet (5 mg total) by mouth daily.  90 tablet  3  . fish oil-omega-3 fatty acids 1000 MG capsule Take 1 g by mouth 3 (three) times daily.      . fluticasone (FLONASE) 50 MCG/ACT nasal spray Place 2 sprays into the nose  daily.  16 g  11  . gabapentin (NEURONTIN) 800 MG tablet Take 1 tablet (800 mg total) by mouth 3 (three) times daily.  270 tablet  3  . lisinopril (PRINIVIL,ZESTRIL) 40 MG tablet Take 1 tablet (40 mg total) by mouth daily.  90 tablet  3  . potassium chloride SA (K-DUR,KLOR-CON) 20 MEQ tablet Take 20 mEq by mouth daily.       . Tamsulosin HCl (FLOMAX) 0.4 MG CAPS Take 1 capsule (0.4 mg total) by mouth daily.  30 capsule  11  . zolpidem (AMBIEN) 10 MG tablet Take 2 tablets (20 mg total) by mouth at bedtime as needed.  30 tablet  0  . [DISCONTINUED] atorvastatin (LIPITOR) 40 MG tablet Take 1 tablet (40 mg total) by mouth at bedtime.  90 tablet  3     Review of Systems  Constitutional: Negative for fever, chills, activity change, appetite change, fatigue and unexpected weight change.  HENT: Negative for hearing loss and neck pain.   Eyes: Negative for visual disturbance.  Respiratory: Negative for cough, chest tightness, shortness of breath and wheezing.   Cardiovascular: Negative for chest pain and leg swelling.  Gastrointestinal: Negative for nausea, vomiting, abdominal pain, diarrhea, constipation, blood in stool and abdominal distention.  Genitourinary: Negative for hematuria and difficulty urinating.  Musculoskeletal: Negative for myalgias and arthralgias.  Skin: Negative for rash.  Neurological: Negative for dizziness, seizures, syncope and headaches.  Hematological: Does not bruise/bleed easily.  Psychiatric/Behavioral: Negative for dysphoric mood. The patient is not nervous/anxious.        Objective:   Physical Exam  Nursing note and vitals reviewed. Constitutional: He is oriented to person, place, and time. He appears well-developed and well-nourished. No distress.       obese  HENT:  Head: Normocephalic and atraumatic.  Right Ear: External ear normal.  Left Ear: External ear normal.  Nose: Nose normal.  Mouth/Throat: Oropharynx is clear and moist. No oropharyngeal exudate.   Eyes: Conjunctivae normal and EOM are normal. Pupils are equal, round, and reactive to light. No scleral icterus.       Slightly larger but still overall pinpoint pupils  Neck: Normal range of motion. Neck supple. Carotid bruit is not present. No thyromegaly present.  Cardiovascular: Normal rate, regular rhythm, normal heart sounds and intact distal pulses.   No murmur heard. Pulses:      Radial pulses are 2+ on the right side, and 2+ on the left side.  Pulmonary/Chest: Effort normal and breath sounds normal. No respiratory distress. He has no wheezes. He has no rales.  Abdominal: Soft. Bowel sounds are normal. He exhibits no distension and no mass. There is  no tenderness. There is no rebound and no guarding.  Musculoskeletal: Normal range of motion. He exhibits no edema.  Lymphadenopathy:    He has no cervical adenopathy.  Neurological: He is alert and oriented to person, place, and time.       CN grossly intact, station and gait intact  Skin: Skin is warm and dry. No rash noted.  Psychiatric: He has a normal mood and affect. His behavior is normal. Judgment and thought content normal.       Assessment & Plan:

## 2012-05-24 NOTE — Assessment & Plan Note (Signed)
Continue to encourage cessation. 

## 2012-05-24 NOTE — Patient Instructions (Signed)
Pneumonia shot today. Stool kit today. Bring me a copy of living will. Keep working on quitting smoking.

## 2012-05-24 NOTE — Assessment & Plan Note (Signed)
Compliant with asa, lipitor, ACEI. asxs.

## 2012-08-23 ENCOUNTER — Ambulatory Visit: Payer: Medicare HMO | Admitting: Family Medicine

## 2012-08-24 ENCOUNTER — Encounter: Payer: Self-pay | Admitting: Family Medicine

## 2012-08-24 ENCOUNTER — Ambulatory Visit (INDEPENDENT_AMBULATORY_CARE_PROVIDER_SITE_OTHER): Payer: Medicare HMO | Admitting: Family Medicine

## 2012-08-24 VITALS — BP 140/88 | HR 84 | Temp 97.7°F

## 2012-08-24 DIAGNOSIS — Z720 Tobacco use: Secondary | ICD-10-CM

## 2012-08-24 DIAGNOSIS — G8929 Other chronic pain: Secondary | ICD-10-CM

## 2012-08-24 DIAGNOSIS — M549 Dorsalgia, unspecified: Secondary | ICD-10-CM

## 2012-08-24 DIAGNOSIS — F17200 Nicotine dependence, unspecified, uncomplicated: Secondary | ICD-10-CM

## 2012-08-24 MED ORDER — MORPHINE SULFATE ER 30 MG PO TBCR: 30.0000 mg | EXTENDED_RELEASE_TABLET | Freq: Two times a day (BID) | ORAL | Status: DC

## 2012-08-24 NOTE — Assessment & Plan Note (Signed)
MS contin refilled.  Will further address taper down next visit. Continue tramadol/ibuprofen prn. Signed pain contract 08/2011

## 2012-08-24 NOTE — Assessment & Plan Note (Signed)
Encouraged continued smoking cessation 

## 2012-08-24 NOTE — Patient Instructions (Addendum)
MS contin refilled. Good to see you today, call us with questions. Return in 6 months for follow up.

## 2012-08-24 NOTE — Progress Notes (Signed)
  Subjective:    Patient ID: Patrick Garner, male    DOB: 1951/04/15, 62 y.o.   MRN: [id number]  HPI CC: 3 mo f/u  Misplaced stool kit, but found again.  Needs to submit and will do.  Here for refill of MS contin 30mg  bid.  Tales tramadol for breakthrough pain.  No constipation.  Takes for chronic back pain.  Walks with cane regularly.  Interested in continuing to cut back on narcotic in future.  Compliant with other meds.  Smoking - down to <1 ppd per day.  Contemplative. Obesity - activity down with weather.  Planning on restarting exercise with improvement in weather.  Past Medical History  Diagnosis Date  . HTN (hypertension)   . HLD (hyperlipidemia)   . Allergic rhinitis   . Chronic back pain     h/o spinal fusion with rods/screw, on morphine/gabapentin  . RLS (restless legs syndrome)   . Peripheral neuropathy   . OSA (obstructive sleep apnea)     refuses sleep eval  . Lateral knee pain   . Insomnia   . BPH (benign prostatic hypertrophy) with urinary obstruction   . History of Bell's palsy   . Chronic narcotic use   . Smoker   . PUD (peptic ulcer disease) 1980s    via EGD  . RBBB 2002  . CAD (coronary artery disease) 2002    by stress test, remote MI    Review of Systems Per HPI    Objective:   Physical Exam  Nursing note and vitals reviewed. Constitutional: He appears well-developed and well-nourished. No distress.  Obese  HENT:  Mouth/Throat: Oropharynx is clear and moist. No oropharyngeal exudate.  Eyes: Conjunctivae and EOM are normal. Pupils are equal, round, and reactive to light. No scleral icterus.  Neck: Normal range of motion. Neck supple. Carotid bruit is not present.  Cardiovascular: Normal rate, regular rhythm, normal heart sounds and intact distal pulses.   No murmur heard. Pulmonary/Chest: Effort normal and breath sounds normal. No respiratory distress. He has no wheezes. He has no rales.  Musculoskeletal: He exhibits no edema.  Psychiatric:  He has a normal mood and affect.       Assessment & Plan:

## 2012-11-10 ENCOUNTER — Other Ambulatory Visit: Payer: Self-pay

## 2012-11-10 NOTE — Telephone Encounter (Signed)
Pt coming thru town from [place] on 11/16/12 in the AM and request to pick up written rx for Potassium Ibuprofen,Gabapentin and Morphine. Pt already scheduled for appt 02/27/13.

## 2012-11-11 MED ORDER — POTASSIUM CHLORIDE CRYS ER 20 MEQ PO TBCR: 20.0000 meq | EXTENDED_RELEASE_TABLET | Freq: Every day | ORAL | Status: DC

## 2012-11-11 MED ORDER — GABAPENTIN 800 MG PO TABS: 800.0000 mg | ORAL_TABLET | Freq: Three times a day (TID) | ORAL | Status: DC

## 2012-11-11 MED ORDER — MORPHINE SULFATE ER 30 MG PO TBCR: 30.0000 mg | EXTENDED_RELEASE_TABLET | Freq: Two times a day (BID) | ORAL | Status: DC

## 2012-11-11 MED ORDER — IBUPROFEN 600 MG PO TABS: 600.0000 mg | ORAL_TABLET | Freq: Two times a day (BID) | ORAL | Status: AC | PRN

## 2012-11-11 NOTE — Telephone Encounter (Signed)
Printed and placed in Kim's box. 

## 2012-11-11 NOTE — Telephone Encounter (Signed)
Patient notified and Rx's placed up front for pick up. 

## 2012-11-16 ENCOUNTER — Encounter: Payer: Self-pay | Admitting: Family Medicine

## 2012-11-29 ENCOUNTER — Encounter: Payer: Self-pay | Admitting: Family Medicine

## 2012-12-15 ENCOUNTER — Other Ambulatory Visit: Payer: Medicare HMO

## 2012-12-15 DIAGNOSIS — Z1211 Encounter for screening for malignant neoplasm of colon: Secondary | ICD-10-CM

## 2012-12-18 ENCOUNTER — Other Ambulatory Visit: Payer: Self-pay | Admitting: Family Medicine

## 2012-12-18 DIAGNOSIS — R195 Other fecal abnormalities: Secondary | ICD-10-CM

## 2012-12-23 ENCOUNTER — Encounter: Payer: Self-pay | Admitting: Gastroenterology

## 2013-02-13 DIAGNOSIS — H348322 Tributary (branch) retinal vein occlusion, left eye, stable: Secondary | ICD-10-CM

## 2013-02-13 HISTORY — DX: Tributary (branch) retinal vein occlusion, left eye, stable: H34.8322

## 2013-02-27 ENCOUNTER — Ambulatory Visit (AMBULATORY_SURGERY_CENTER): Payer: Self-pay | Admitting: *Deleted

## 2013-02-27 ENCOUNTER — Encounter: Payer: Self-pay | Admitting: *Deleted

## 2013-02-27 ENCOUNTER — Encounter: Payer: Self-pay | Admitting: Family Medicine

## 2013-02-27 ENCOUNTER — Ambulatory Visit (INDEPENDENT_AMBULATORY_CARE_PROVIDER_SITE_OTHER): Payer: Medicare HMO | Admitting: Family Medicine

## 2013-02-27 VITALS — Ht 69.0 in | Wt 298.8 lb

## 2013-02-27 VITALS — BP 142/98 | HR 84 | Temp 98.0°F | Wt 298.5 lb

## 2013-02-27 DIAGNOSIS — E785 Hyperlipidemia, unspecified: Secondary | ICD-10-CM

## 2013-02-27 DIAGNOSIS — K921 Melena: Secondary | ICD-10-CM

## 2013-02-27 DIAGNOSIS — F17200 Nicotine dependence, unspecified, uncomplicated: Secondary | ICD-10-CM

## 2013-02-27 DIAGNOSIS — Z720 Tobacco use: Secondary | ICD-10-CM

## 2013-02-27 DIAGNOSIS — M549 Dorsalgia, unspecified: Secondary | ICD-10-CM

## 2013-02-27 DIAGNOSIS — G8929 Other chronic pain: Secondary | ICD-10-CM

## 2013-02-27 DIAGNOSIS — I10 Essential (primary) hypertension: Secondary | ICD-10-CM

## 2013-02-27 DIAGNOSIS — E669 Obesity, unspecified: Secondary | ICD-10-CM

## 2013-02-27 LAB — BASIC METABOLIC PANEL WITH GFR
BUN: 16 mg/dL (ref 6–23)
CO2: 29 meq/L (ref 19–32)
Calcium: 9 mg/dL (ref 8.4–10.5)
Chloride: 107 meq/L (ref 96–112)
Creatinine, Ser: 0.9 mg/dL (ref 0.4–1.5)
GFR: 91.64 mL/min
Glucose, Bld: 106 mg/dL — ABNORMAL HIGH (ref 70–99)
Potassium: 3.2 meq/L — ABNORMAL LOW (ref 3.5–5.1)
Sodium: 141 meq/L (ref 135–145)

## 2013-02-27 MED ORDER — LISINOPRIL 40 MG PO TABS: 40.0000 mg | ORAL_TABLET | Freq: Every day | ORAL | Status: DC

## 2013-02-27 MED ORDER — MORPHINE SULFATE ER 15 MG PO TBCR: 30.0000 mg | EXTENDED_RELEASE_TABLET | Freq: Two times a day (BID) | ORAL | Status: DC

## 2013-02-27 MED ORDER — MORPHINE SULFATE ER 15 MG PO TBCR
30.0000 mg | EXTENDED_RELEASE_TABLET | Freq: Two times a day (BID) | ORAL | Status: DC
Start: 2013-02-27 — End: 2013-05-23

## 2013-02-27 MED ORDER — NA SULFATE-K SULFATE-MG SULF 17.5-3.13-1.6 GM/177ML PO SOLN: 1.0000 | Freq: Once | ORAL | Status: DC

## 2013-02-27 MED ORDER — FINASTERIDE 5 MG PO TABS: 5.0000 mg | ORAL_TABLET | Freq: Every day | ORAL | Status: DC

## 2013-02-27 MED ORDER — TAMSULOSIN HCL 0.4 MG PO CAPS: 0.4000 mg | ORAL_CAPSULE | Freq: Every day | ORAL | Status: DC

## 2013-02-27 NOTE — Assessment & Plan Note (Signed)
13lb weight gain noted. Encouraged increased regular activity to affect weight loss.

## 2013-02-27 NOTE — Progress Notes (Signed)
No egg or soy allergy. ewm No problems with past sedations. ewm Pt does not use CPAP, no home 02 use. ewm No previous colonoscopy per pt. ewm Pt declines emmi video. ewm

## 2013-02-27 NOTE — Progress Notes (Signed)
  Subjective:    Patient ID: Patrick Garner, male    DOB: [birth year], 62 y.o.   MRN: [id number]  HPI CC: 6 mo f/u  Chronic pain - on MS contin 30mg  bid. Tales tramadol for breakthrough pain. Self weaned off oxycodone and taper down from MS contin 60mg  bid.  No constipation. Takes for chronic back pain s/p surgery. Walks with cane regularly. Interested in continuing to cut back on narcotic in future. Has had more difficulty filling prescription 2/2 pharmacist. Increased pain 2/2 out of med for last 2 weeks.  Had been taking some left over oxycodone.  Has not been taking tramadol.  Takes ibuprofen 600mg  bid as well.  Recent mini stroke behind eye - sees ophtho in [place].  Smoking - down to <1 ppd per day. Contemplative.  Positive stool kit - has appt with Dr Arlyce Dice scheduled for end of September.  Denies bleed.  H/o hemorrhoids.  HLD - lipitor 40mg  daily - compliant with med.  No myalgias.  HTN - compliant with lisinopril 40mg  daily.  bp elevated today.  Drank coffee - up since 2am.  Drove from [place].  Endorses good control at home and at pharmacy when he checks bp. BP Readings from Last 3 Encounters:  02/27/13 142/98  08/24/12 140/88  05/24/12 136/96    Obesity - weight up 15 lbs.  Eating ice cream nightly. Thinking about joining friend's gym. Wt Readings from Last 3 Encounters:  02/27/13 298 lb 8 oz (135.399 kg)  05/24/12 286 lb (129.729 kg)  02/24/12 283 lb (128.368 kg)   Body mass index is 44.06 kg/(m^2).  Past Medical History  Diagnosis Date  . HTN (hypertension)   . HLD (hyperlipidemia)   . Allergic rhinitis   . Chronic back pain     h/o spinal fusion with rods/screw, on morphine/gabapentin  . RLS (restless legs syndrome)   . Peripheral neuropathy   . OSA (obstructive sleep apnea)     refuses sleep eval  . Lateral knee pain   . Insomnia   . BPH (benign prostatic hypertrophy) with urinary obstruction   . History of Bell's palsy   . Chronic narcotic use   . Smoker    . PUD (peptic ulcer disease) 1980s    via EGD  . RBBB 2002  . CAD (coronary artery disease) 2002    by stress test, remote MI    Past Surgical History  Procedure Laterality Date  . Knee surgery  2008  . Back surgery  2002  . Hernia repair  2005  . Tonsillectomy  1950's  . 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 Constitutional: He appears well-developed and well-nourished. No distress.  HENT:  Mouth/Throat: Oropharynx is clear and moist. No oropharyngeal exudate.  Neck: Carotid bruit is not present.  Cardiovascular: Normal rate, regular rhythm, normal heart sounds and intact distal pulses.   No murmur heard. Pulmonary/Chest: Effort normal and breath sounds normal. No respiratory distress. He has no wheezes. He has no rales.  slightlycoarse  Musculoskeletal: He exhibits no edema.  Skin: Skin is warm and dry. No rash noted.  Psychiatric: He has a normal mood and affect.       Assessment & Plan:

## 2013-02-27 NOTE — Assessment & Plan Note (Signed)
Encouraged continued smoking cessation. Down to 3/4 ppd.

## 2013-02-27 NOTE — Assessment & Plan Note (Signed)
Elevated, however pt states runs better controlled at home and at local pharmacy. No changes today. Encouraged increased activity to affect weight loss.

## 2013-02-27 NOTE — Patient Instructions (Signed)
Slowly wean off ms contin - goal will be 15mg  twice daily.  May do 2 pills in am and 1 pill at night to start. Flu shot today. Blood work today. Return in 6 months for next wellness visit

## 2013-02-27 NOTE — Assessment & Plan Note (Signed)
Discussed slow wean down. Pt remains interested in this. Will prescribe 15mg  bid ms contin dose, have him slowly taper down - try 2 pills in am and 1 pill at night, goal for next visit in 6 mo will be 15mg  bid. Continue tramadol/iburpfen prn.  Pt feels ibuprofen helps pain as much as narcotic. Discussed avoiding NSAID with ASA (ie take aspirin in am and ibuprofen later in the day.)

## 2013-02-27 NOTE — Assessment & Plan Note (Signed)
Chronic. Tolerates well.  lipitor controlling lipids well on last check.

## 2013-03-02 ENCOUNTER — Encounter: Payer: Self-pay | Admitting: Family Medicine

## 2013-03-02 ENCOUNTER — Encounter: Payer: Self-pay | Admitting: Gastroenterology

## 2013-03-11 ENCOUNTER — Other Ambulatory Visit: Payer: Self-pay | Admitting: Family Medicine

## 2013-03-13 ENCOUNTER — Encounter: Payer: Medicare HMO | Admitting: Gastroenterology

## 2013-03-13 ENCOUNTER — Telehealth: Payer: Self-pay | Admitting: Gastroenterology

## 2013-03-13 ENCOUNTER — Encounter: Payer: Self-pay | Admitting: Gastroenterology

## 2013-03-13 NOTE — Telephone Encounter (Signed)
Pt checking on status of flonase; pt will ck with pharmacy.

## 2013-03-13 NOTE — Telephone Encounter (Signed)
No show

## 2013-05-23 ENCOUNTER — Other Ambulatory Visit: Payer: Self-pay

## 2013-05-23 MED ORDER — MORPHINE SULFATE ER 15 MG PO TBCR: 30.0000 mg | EXTENDED_RELEASE_TABLET | Freq: Two times a day (BID) | ORAL | Status: DC

## 2013-05-23 NOTE — Telephone Encounter (Signed)
Printed and placed in Kim's box. 

## 2013-05-23 NOTE — Telephone Encounter (Signed)
Pt left v/m requesting rx morphine, call when ready for pickup.

## 2013-05-24 NOTE — Telephone Encounter (Signed)
Patient notified and Rx's placed up front for pick up. 

## 2013-07-05 ENCOUNTER — Other Ambulatory Visit: Payer: Self-pay | Admitting: Family Medicine

## 2013-08-15 ENCOUNTER — Telehealth: Payer: Self-pay | Admitting: Family Medicine

## 2013-08-15 ENCOUNTER — Encounter: Payer: Self-pay | Admitting: Family Medicine

## 2013-08-15 NOTE — Telephone Encounter (Signed)
Relevant patient education mailed to patient.  

## 2013-08-28 ENCOUNTER — Encounter: Payer: Medicare HMO | Admitting: Family Medicine

## 2013-08-29 ENCOUNTER — Ambulatory Visit (INDEPENDENT_AMBULATORY_CARE_PROVIDER_SITE_OTHER): Payer: Medicare HMO | Admitting: Family Medicine

## 2013-08-29 ENCOUNTER — Encounter: Payer: Self-pay | Admitting: Family Medicine

## 2013-08-29 ENCOUNTER — Telehealth: Payer: Self-pay | Admitting: Family Medicine

## 2013-08-29 VITALS — BP 140/100 | HR 84 | Temp 97.7°F | Wt 301.0 lb

## 2013-08-29 DIAGNOSIS — N138 Other obstructive and reflux uropathy: Secondary | ICD-10-CM

## 2013-08-29 DIAGNOSIS — E785 Hyperlipidemia, unspecified: Secondary | ICD-10-CM

## 2013-08-29 DIAGNOSIS — Z0000 Encounter for general adult medical examination without abnormal findings: Secondary | ICD-10-CM

## 2013-08-29 DIAGNOSIS — Z1211 Encounter for screening for malignant neoplasm of colon: Secondary | ICD-10-CM

## 2013-08-29 DIAGNOSIS — N401 Enlarged prostate with lower urinary tract symptoms: Secondary | ICD-10-CM

## 2013-08-29 DIAGNOSIS — I10 Essential (primary) hypertension: Secondary | ICD-10-CM

## 2013-08-29 DIAGNOSIS — Z720 Tobacco use: Secondary | ICD-10-CM

## 2013-08-29 DIAGNOSIS — N139 Obstructive and reflux uropathy, unspecified: Secondary | ICD-10-CM

## 2013-08-29 DIAGNOSIS — F17200 Nicotine dependence, unspecified, uncomplicated: Secondary | ICD-10-CM

## 2013-08-29 LAB — BASIC METABOLIC PANEL
BUN: 17 mg/dL (ref 6–23)
CALCIUM: 9.3 mg/dL (ref 8.4–10.5)
CO2: 30 mEq/L (ref 19–32)
CREATININE: 1.2 mg/dL (ref 0.4–1.5)
Chloride: 105 mEq/L (ref 96–112)
GFR: 62.4 mL/min (ref >60.00)
GLUCOSE: 85 mg/dL (ref 70–99)
Potassium: 3.7 mEq/L (ref 3.5–5.1)
Sodium: 145 mEq/L (ref 135–145)

## 2013-08-29 LAB — LDL CHOLESTEROL, DIRECT: Direct LDL: 76.9 mg/dL

## 2013-08-29 LAB — PSA: PSA: 0.15 ng/mL (ref 0.10–4.00)

## 2013-08-29 MED ORDER — MORPHINE SULFATE ER 15 MG PO TBCR: 30.0000 mg | EXTENDED_RELEASE_TABLET | Freq: Two times a day (BID) | ORAL | Status: DC

## 2013-08-29 MED ORDER — POTASSIUM CHLORIDE CRYS ER 20 MEQ PO TBCR: 20.0000 meq | EXTENDED_RELEASE_TABLET | Freq: Every day | ORAL | Status: DC

## 2013-08-29 MED ORDER — GABAPENTIN 800 MG PO TABS: 800.0000 mg | ORAL_TABLET | Freq: Three times a day (TID) | ORAL | Status: DC

## 2013-08-29 MED ORDER — IBUPROFEN 600 MG PO TABS: 600.0000 mg | ORAL_TABLET | Freq: Four times a day (QID) | ORAL | Status: DC | PRN

## 2013-08-29 NOTE — Assessment & Plan Note (Signed)
I have personally reviewed the Medicare Annual Wellness questionnaire and have noted 1. The patient's medical and social history 2. Their use of alcohol, tobacco or illicit drugs 3. Their current medications and supplements 4. The patient's functional ability including ADL's, fall risks, home safety risks and hearing or visual impairment. 5. Diet and physical activity 6. Evidence for depression or mood disorders The patients weight, height, BMI have been recorded in the chart.  Hearing and vision has been addressed. I have made referrals, counseling and provided education to the patient based review of the above and I have provided the pt with a written personalized care plan for preventive services. See scanned questionairre. Advanced directives discussed: pt will bring in copy to next office visit.  Reviewed preventative protocols and updated unless pt declined. UTD immunizations. Refuses colonsocopy despite positive ifob - attributes to active hemorrhoids at that time, will rpt ifob today.

## 2013-08-29 NOTE — Assessment & Plan Note (Signed)
Declines DRE. Check PSA today.

## 2013-08-29 NOTE — Assessment & Plan Note (Signed)
-

## 2013-08-29 NOTE — Assessment & Plan Note (Signed)
Check LDL today

## 2013-08-29 NOTE — Progress Notes (Addendum)
BP 140/100  Pulse 84  Temp(Src) 97.7 F (36.5 C) (Oral)  Wt 301 lb (136.533 kg)   CC: medicare wellness visit  Subjective:    Patient ID: Patrick Garner, male    DOB: 08-23-1950, 63 y.o.   MRN: [id number]  HPI: Patrick Garner is a 63 y.o. male presenting on 08/29/2013 for Annual Exam   Recently pulled muscle while cleaning out snow, felt snap about 2 wks ago, has large ecchymosis right medial arm.  No significant weakness, just soreness initially.  Not fasting today.  Passes hearing and vision screens today.  Denies sadness, depression, anhedonia.  PHQ9 = 4 No falls in last year.  Preventative:  Colon cancer screening - positive stool kit, no showed for colonoscopy Deatra Ina).  States was too expensive.   Prostate cancer screening - pt does not want further prostate exams. Declines screening this year. Would be ok with PSA test.  H/o BPH with latest PSA 0.27 (03/2011). Denies further sxs, flomax and proscar working well  Tetanus 2008.  Flu shot 02/2012  Pneumovax - 05/2012.  smoker. H/o CAD.  Shingles shot - declines Advanced directives: has living will at home. Asked to bring copy.   Caffeine: rare  Lives with wife, 1 cat  Live in [place], commutes for medical care. 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  Allergies and medications reviewed and updated. Current Outpatient Prescriptions on File Prior to Visit  Medication Sig  . aspirin 325 MG tablet Take 325 mg by mouth daily.  Marland Kitchen atorvastatin (LIPITOR) 40 MG tablet TAKE ONE TABLET BY MOUTH EVERY NIGHT AT BEDTIME  . [employer] SALINE NOSE SPRAY NA Place 1 spray into the nose daily as needed.  Mariane Baumgarten Calcium (STOOL SOFTENER PO) Take 1 capsule by mouth daily as needed.  . finasteride (PROSCAR) 5 MG tablet Take 1 tablet (5 mg total) by mouth daily.  . fish  oil-omega-3 fatty acids 1000 MG capsule Take 1 g by mouth 3 (three) times daily.  . fluticasone (FLONASE) 50 MCG/ACT nasal spray INSERT 2 SPRAYS NASALLY ONCE DAILY AS DIRECTED  . lisinopril (PRINIVIL,ZESTRIL) 40 MG tablet Take 1 tablet (40 mg total) by mouth daily.  . Na Sulfate-K Sulfate-Mg Sulf (SUPREP BOWEL PREP) SOLN Take 1 kit by mouth once. suprep as directed. No substitutions  . traMADol (ULTRAM) 50 MG tablet Take 1 tablet (50 mg total) by mouth 2 (two) times daily as needed.  . zolpidem (AMBIEN) 10 MG tablet Take 10 mg by mouth at bedtime as needed.  . tamsulosin (FLOMAX) 0.4 MG CAPS capsule Take 1 capsule (0.4 mg total) by mouth daily.   No current facility-administered medications on file prior to visit.    Review of Systems Per HPI unless specifically indicated above    Objective:    BP 140/100  Pulse 84  Temp(Src) 97.7 F (36.5 C) (Oral)  Wt 301 lb (136.533 kg)  Physical Exam  Nursing note and vitals reviewed. Constitutional: He is oriented to person, place, and time. He appears well-developed and well-nourished. No distress.  Morbid obesity  HENT:  Head: Normocephalic and atraumatic.  Right Ear: Hearing, tympanic membrane, external ear and ear canal normal.  Left Ear: Hearing, tympanic membrane, external ear and ear canal normal.  Nose: Nose normal.  Mouth/Throat: Uvula is midline, oropharynx is clear and moist and  mucous membranes are normal. No oropharyngeal exudate, posterior oropharyngeal edema or posterior oropharyngeal erythema.  Eyes: Conjunctivae and EOM are normal. Pupils are equal, round, and reactive to light. No scleral icterus.  Neck: Normal range of motion. Neck supple. Carotid bruit is not present.  Cardiovascular: Normal rate, regular rhythm, normal heart sounds and intact distal pulses.   No murmur heard. Pulses:      Radial pulses are 2+ on the right side, and 2+ on the left side.  Pulmonary/Chest: Effort normal and breath sounds normal. No  respiratory distress. He has no wheezes. He has no rales.  Abdominal: Soft. Bowel sounds are normal. He exhibits no distension and no mass. There is no tenderness. There is no rebound and no guarding.  Musculoskeletal: Normal range of motion. He exhibits no edema.  Lymphadenopathy:    He has no cervical adenopathy.  Neurological: He is alert and oriented to person, place, and time.  CN grossly intact, station and gait intact 2/3 recall, 3/3 with cue 4/5 calculation  Skin: Skin is warm and dry. No rash noted.  Psychiatric: He has a normal mood and affect. His behavior is normal. Judgment and thought content normal.       Assessment & Plan:   Problem List Items Addressed This Visit   BPH (benign prostatic hypertrophy) with urinary obstruction     Declines DRE. Check PSA today.    Relevant Orders      PSA   HLD (hyperlipidemia)     Check LDL today.    Relevant Orders      LDL Cholesterol, Direct   HTN (hypertension)     Chronic, elevated. Advised monitor at home and will reassess at next visit.    Relevant Orders      Basic metabolic panel   Medicare annual wellness visit, subsequent - Primary     I have personally reviewed the Medicare Annual Wellness questionnaire and have noted 1. The patient's medical and social history 2. Their use of alcohol, tobacco or illicit drugs 3. Their current medications and supplements 4. The patient's functional ability including ADL's, fall risks, home safety risks and hearing or visual impairment. 5. Diet and physical activity 6. Evidence for depression or mood disorders The patients weight, height, BMI have been recorded in the chart.  Hearing and vision has been addressed. I have made referrals, counseling and provided education to the patient based review of the above and I have provided the pt with a written personalized care plan for preventive services. See scanned questionairre. Advanced directives discussed: pt will bring in copy to  next office visit.  Reviewed preventative protocols and updated unless pt declined. UTD immunizations. Refuses colonsocopy despite positive ifob - attributes to active hemorrhoids at that time, will rpt ifob today.    Tobacco abuse     continue to encourage cessation.     Other Visit Diagnoses   Special screening for malignant neoplasms, colon        Relevant Orders       Fecal occult blood, imunochemical        Follow up plan: Return in about 6 months (around 03/01/2014), or as needed, for follow up visit.

## 2013-08-29 NOTE — Telephone Encounter (Signed)
Relevant patient education mailed to patient.  

## 2013-08-29 NOTE — Assessment & Plan Note (Signed)
Chronic, elevated. Advised monitor at home and will reassess at next visit.

## 2013-08-29 NOTE — Progress Notes (Signed)
Pre visit review using our clinic review tool, if applicable. No additional management support is needed unless otherwise documented below in the visit note. 

## 2013-08-29 NOTE — Patient Instructions (Addendum)
Start monitoring blood pressure at home - and if consistently >140/90 let me know to change blood pressure medicines. Bring me copy of living will in 3 months. Your stool kit last year returned positive for blood - I recommend colonoscopy.  Let me know if you decide to have this done.  We will send another stool kit today. 

## 2013-08-31 ENCOUNTER — Encounter: Payer: Self-pay | Admitting: *Deleted

## 2013-10-13 DIAGNOSIS — I480 Paroxysmal atrial fibrillation: Secondary | ICD-10-CM

## 2013-10-13 DIAGNOSIS — I5020 Unspecified systolic (congestive) heart failure: Secondary | ICD-10-CM

## 2013-10-13 DIAGNOSIS — I272 Pulmonary hypertension, unspecified: Secondary | ICD-10-CM | POA: Insufficient documentation

## 2013-10-13 HISTORY — PX: CARDIOVASCULAR STRESS TEST: SHX262

## 2013-10-13 HISTORY — DX: Paroxysmal atrial fibrillation: I48.0

## 2013-10-13 HISTORY — PX: CARDIAC CATHETERIZATION: SHX172

## 2013-10-13 HISTORY — DX: Pulmonary hypertension, unspecified: I27.20

## 2013-10-13 HISTORY — DX: Unspecified systolic (congestive) heart failure: I50.20

## 2013-10-16 ENCOUNTER — Ambulatory Visit: Payer: Medicare HMO | Admitting: Family Medicine

## 2013-11-17 ENCOUNTER — Telehealth: Payer: Self-pay | Admitting: Family Medicine

## 2013-11-17 NOTE — Telephone Encounter (Signed)
Message left for patient's wife to return my call.  

## 2013-11-17 NOTE — Telephone Encounter (Signed)
plz call for update on what is going on with patient.

## 2013-11-17 NOTE — Telephone Encounter (Signed)
Pt's wife called to cancel appointment scheduled for 11/21/13.  Pt is in intensive care at [facility] in [place], [place].  His physician is Dr. Heath Lark.  Best number to call Patrick Garner is [phone]

## 2013-11-20 NOTE — Telephone Encounter (Signed)
Message left for patient's wife to return my call.  

## 2013-11-20 NOTE — Telephone Encounter (Signed)
Spoke with pt's wife.  They are sending to [place] or tomorrow to do a CABG (they operate on high risk pt's according to his wife).  They took pt off of ventilator yesterday but he did not do well so they put him back on this morning.  She says that they have taken off  30lbs of fluid.  She will call our office if she needs anything.

## 2013-11-21 ENCOUNTER — Ambulatory Visit: Payer: Medicare HMO | Admitting: Family Medicine

## 2013-12-13 DIAGNOSIS — K922 Gastrointestinal hemorrhage, unspecified: Secondary | ICD-10-CM

## 2013-12-13 DIAGNOSIS — Z950 Presence of cardiac pacemaker: Secondary | ICD-10-CM

## 2013-12-13 DIAGNOSIS — Z8719 Personal history of other diseases of the digestive system: Secondary | ICD-10-CM | POA: Insufficient documentation

## 2013-12-13 HISTORY — DX: Presence of cardiac pacemaker: Z95.0

## 2013-12-13 HISTORY — DX: Gastrointestinal hemorrhage, unspecified: K92.2

## 2013-12-13 HISTORY — PX: US ECHOCARDIOGRAPHY: HXRAD669

## 2014-01-04 ENCOUNTER — Telehealth: Payer: Self-pay | Admitting: Family Medicine

## 2014-01-04 HISTORY — PX: PACEMAKER INSERTION: SHX728

## 2014-01-04 NOTE — Telephone Encounter (Signed)
Pt is in [place] and had to have a pacemaker placed. He is coming to you on 01/09/2014 to check the wound and to get a referral to a cardiologist to monitor the pacemaker. Mrs. Langille says pt has signed a release and she will have records faxed here to your attn. Thank you.

## 2014-01-04 NOTE — Telephone Encounter (Signed)
Noted.     Will await records.

## 2014-01-09 ENCOUNTER — Ambulatory Visit: Payer: Medicare HMO | Admitting: Family Medicine

## 2014-02-10 ENCOUNTER — Encounter: Payer: Self-pay | Admitting: Family Medicine

## 2014-02-10 NOTE — Telephone Encounter (Signed)
Received and reviewed records available (mostly from [place] hospitalization 12/2013). Initially admitted 11/01/2013 to [facility] hospital with chest pain/dyspnea, workup revealed atrial fibrillation, sCHF exacerbation and diffuse CAD on catheterization by Dr. 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 CVTS decided against CABG, rec medical management. Discharged at that time. Unclear timeline but later transferred 12/21/2013 to [facility] with acute sCHF exacerbation, atrial fibrillation and nonischemic cardiomyopathy, ARF and HTN/sepsis, VDRF and metabolic encephalopathy. Sepsis 2/2 MRSA PNA and MRSE bacteremia. Could not tolerate carvedilol 2/2 bradycardia and frequent pauses. ICD placed (dual chamber PPM/ICD) for primary prevention in sCHF with depressed EF <35%. rec ICD interrogation in 3 mo (~03/2014). CHADS2VASc = 3 on xarelto. Echo at [place] - EF 25-35% with mod global hypokinesis

## 2014-02-12 ENCOUNTER — Encounter: Payer: Self-pay | Admitting: Family Medicine

## 2014-02-12 ENCOUNTER — Ambulatory Visit (INDEPENDENT_AMBULATORY_CARE_PROVIDER_SITE_OTHER): Payer: Medicare FFS | Admitting: Family Medicine

## 2014-02-12 VITALS — BP 136/100 | HR 82 | Temp 97.4°F | Wt 275.5 lb

## 2014-02-12 DIAGNOSIS — E785 Hyperlipidemia, unspecified: Secondary | ICD-10-CM

## 2014-02-12 DIAGNOSIS — I4891 Unspecified atrial fibrillation: Secondary | ICD-10-CM

## 2014-02-12 DIAGNOSIS — I4819 Other persistent atrial fibrillation: Secondary | ICD-10-CM

## 2014-02-12 DIAGNOSIS — Z950 Presence of cardiac pacemaker: Secondary | ICD-10-CM

## 2014-02-12 DIAGNOSIS — I2789 Other specified pulmonary heart diseases: Secondary | ICD-10-CM

## 2014-02-12 DIAGNOSIS — M549 Dorsalgia, unspecified: Secondary | ICD-10-CM

## 2014-02-12 DIAGNOSIS — I5022 Chronic systolic (congestive) heart failure: Secondary | ICD-10-CM

## 2014-02-12 DIAGNOSIS — G4733 Obstructive sleep apnea (adult) (pediatric): Secondary | ICD-10-CM

## 2014-02-12 DIAGNOSIS — K921 Melena: Secondary | ICD-10-CM

## 2014-02-12 DIAGNOSIS — I10 Essential (primary) hypertension: Secondary | ICD-10-CM

## 2014-02-12 DIAGNOSIS — G8929 Other chronic pain: Secondary | ICD-10-CM

## 2014-02-12 DIAGNOSIS — I272 Pulmonary hypertension, unspecified: Secondary | ICD-10-CM

## 2014-02-12 DIAGNOSIS — I2510 Atherosclerotic heart disease of native coronary artery without angina pectoris: Secondary | ICD-10-CM

## 2014-02-12 DIAGNOSIS — Z23 Encounter for immunization: Secondary | ICD-10-CM

## 2014-02-12 DIAGNOSIS — Z87891 Personal history of nicotine dependence: Secondary | ICD-10-CM

## 2014-02-12 DIAGNOSIS — D62 Acute posthemorrhagic anemia: Secondary | ICD-10-CM

## 2014-02-12 LAB — CBC WITH DIFFERENTIAL/PLATELET
Basophils Absolute: 0 10*3/uL (ref 0.0–0.1)
Basophils Relative: 0.5 % (ref 0.0–3.0)
EOS PCT: 4 % (ref 0.0–5.0)
Eosinophils Absolute: 0.4 10*3/uL (ref 0.0–0.7)
HCT: 39.1 % (ref 39.0–52.0)
Hemoglobin: 12.7 g/dL — ABNORMAL LOW (ref 13.0–17.0)
LYMPHS PCT: 16 % (ref 12.0–46.0)
Lymphs Abs: 1.5 10*3/uL (ref 0.7–4.0)
MCHC: 32.6 g/dL (ref 30.0–36.0)
MCV: 85 fl (ref 78.0–100.0)
MONO ABS: 0.8 10*3/uL (ref 0.1–1.0)
Monocytes Relative: 8.1 % (ref 3.0–12.0)
NEUTROS PCT: 71.4 % (ref 43.0–77.0)
Neutro Abs: 6.8 10*3/uL (ref 1.4–7.7)
PLATELETS: 228 10*3/uL (ref 150.0–400.0)
RBC: 4.6 Mil/uL (ref 4.22–5.81)
RDW: 16.5 % — ABNORMAL HIGH (ref 11.5–15.5)
WBC: 9.6 10*3/uL (ref 4.0–10.5)

## 2014-02-12 LAB — RENAL FUNCTION PANEL
Albumin: 3.4 g/dL — ABNORMAL LOW (ref 3.5–5.2)
BUN: 20 mg/dL (ref 6–23)
CALCIUM: 9.1 mg/dL (ref 8.4–10.5)
CHLORIDE: 108 meq/L (ref 96–112)
CO2: 27 mEq/L (ref 19–32)
Creatinine, Ser: 0.9 mg/dL (ref 0.4–1.5)
GFR: 89.05 mL/min (ref >60.00)
Glucose, Bld: 104 mg/dL — ABNORMAL HIGH (ref 70–99)
POTASSIUM: 3.6 meq/L (ref 3.5–5.1)
Phosphorus: 3.6 mg/dL (ref 2.3–4.6)
Sodium: 143 mEq/L (ref 135–145)

## 2014-02-12 LAB — TSH: TSH: 1.6 u[IU]/mL (ref 0.35–4.50)

## 2014-02-12 MED ORDER — SOTALOL HCL 80 MG PO TABS: 40.0000 mg | ORAL_TABLET | Freq: Two times a day (BID) | ORAL | Status: DC

## 2014-02-12 MED ORDER — POTASSIUM CHLORIDE CRYS ER 10 MEQ PO TBCR: 10.0000 meq | EXTENDED_RELEASE_TABLET | Freq: Once | ORAL | Status: DC

## 2014-02-12 MED ORDER — FUROSEMIDE 20 MG PO TABS: 10.0000 mg | ORAL_TABLET | Freq: Every day | ORAL | Status: DC

## 2014-02-12 MED ORDER — PANTOPRAZOLE SODIUM 40 MG PO TBEC: 40.0000 mg | DELAYED_RELEASE_TABLET | Freq: Two times a day (BID) | ORAL | Status: DC

## 2014-02-12 NOTE — Patient Instructions (Addendum)
Flu shot today. I've refilled your medicines. Blood work today - we will call you with results, and if stable, I will recommend start lasix 1/2 tablet daily along with potassium 1 tab daily. Pass by Marion's office for referral to local cardiologist in [place]. Think about further evaluation of sleep apnea and CPAP machine. Congratulations on quitting smoking! Return to see me in 3 months, sooner if needed.

## 2014-02-12 NOTE — Progress Notes (Signed)
BP 136/100  Pulse 82  Temp(Src) 97.4 F (36.3 C) (Oral)  Wt 275 lb 8 oz (124.966 kg)  SpO2 97%   CC: hosp f/u visit  Subjective:    Patient ID: Patrick Garner, male    DOB: 08-10-1950, 63 y.o.   MRN: [id number]  HPI: Patrick Garner is a 63 y.o. male presenting on 02/12/2014 for Follow-up   Received and reviewed records available (mostly from [place] hospitalization 12/2013). Initially admitted 11/01/2013 to [facility] hospital with chest pain/dyspnea, workup revealed atrial fibrillation, sCHF exacerbation and diffuse CAD on catheterization by Dr. 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 CVTS decided against CABG, rec medical management. Discharged at that time. Unclear timeline but later transferred 12/21/2013 to [facility] with acute sCHF exacerbation, atrial fibrillation and nonischemic cardiomyopathy, ARF and HTN/sepsis, VDRF and metabolic encephalopathy. Sepsis 2/2 MRSA PNA and MRSE bacteremia. Could not tolerate carvedilol 2/2 bradycardia and frequent pauses. ICD placed (dual chamber PPM/ICD) for primary prevention in sCHF with depressed EF <35%. rec ICD interrogation in 3 mo (~03/2014). CHADS2VASc = 3 on xarelto. Echo at [place] - EF 25-35% with mod global hypokinesis.  After this hospitalization - rehospitalized with melena bleed s/p colonoscopy/EGD and blood transfusion x8 units. Xarelto stopped and aspirin 81mg  daily was started. According to patient, 10 polyps removed. Returned to see GI - rec f/u in 2 months for labwork. Wife brings D/C summary from this hospitalization - Hgb maintained at 9.7. GIBleed with melena - EGD showed mild duodenitis and gastritis, colonoscopy neg for acute bleed. Coreg was stopped, sotalol 40mg  bid was started for rate control. Current echo  01/09/2014 showed EF 45-50% an improvement from prior echo. MRSA + colonization. asxs bacteriuria with UCx positive for enterococcus rec against treatment by ID.  Still living in [place]. Returning to [place] later today. Would like to establish with Dr. Heath Lark Interventional Cardiologist. They have appt scheduled with Dr Regino Schultze in [place] for pacer interrogation.   Off gabapentin 2/2 ?bradycardia, off morphine. Quit smoking!   Notes neuropathy returning but not bothersome to point of wanting to restart gabapentin. Denies chest pain, dysuria, abd pain, nausea, vomiting, diarrhea or constipation. No blood in stool. Occasional dyspnea worse with exertion as well as nonproductive cough. Hoarse voice persists from recent intubations.    Wt Readings from Last 3 Encounters:  02/12/14 275 lb 8 oz (124.966 kg)  08/29/13 301 lb (136.533 kg)  02/27/13 298 lb 12.8 oz (135.535 kg)   Body mass index is 40.67 kg/(m^2).  Relevant past medical, surgical, family and social history reviewed and updated as indicated.  Allergies and medications reviewed and updated. Current Outpatient Prescriptions on File Prior to Visit  Medication Sig  . finasteride (PROSCAR) 5 MG tablet Take 1 tablet (5 mg total) by mouth daily.   No current facility-administered medications on file prior to visit.   Patient Active Problem List   Diagnosis Date Noted  . Atrial fibrillation 02/14/2014  . CAD (coronary artery disease)   . Acute blood loss anemia   . Pacemaker 12/13/2013  . GI bleed 12/13/2013  . Systolic CHF 09/38/1829  . Pulmonary HTN 10/13/2013  . Obesity 02/27/2013  . Medicare annual wellness visit, subsequent 05/24/2012  . Ex-smoker 11/24/2011  . HTN (hypertension)   . HLD (hyperlipidemia)   . Chronic  back pain   . RLS (restless legs syndrome)   . Peripheral neuropathy   . OSA (obstructive sleep apnea)   . Insomnia   . BPH (benign prostatic hypertrophy) with urinary obstruction   . History  of coronary artery disease    Past Medical History  Diagnosis Date  . HTN (hypertension)   . HLD (hyperlipidemia)   . Allergic rhinitis   . Chronic back pain     h/o spinal fusion with rods/screw, on morphine/gabapentin  . RLS (restless legs syndrome)   . Peripheral neuropathy   . OSA (obstructive sleep apnea)     refuses sleep eval  . Insomnia   . BPH (benign prostatic hypertrophy) with urinary obstruction   . History of Bell's palsy   . Chronic narcotic use   . Smoker   . PUD (peptic ulcer disease) 1980s    via EGD  . RBBB 2002  . CAD (coronary artery disease) 2002, 2015    remote MI, inferior wall MI, rec medical management  . Branch retinal vein occlusion of left eye 02/2013    Ophtho [place] (Avastin then monitor)  . 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 Systolic CHF 01/1016    EF 27% on 2d lexisccan cardiolite stress test [facility])  . Atrial fibrillation 10/2013    new onset  . Pacemaker 12/2013    for sCHF with depressed EF  . GI bleed 12/2013    hospitalization s/p unrevealing EGD/colonoscopy  . Acute blood loss anemia    Past Surgical History  Procedure Laterality Date  . Knee surgery  2008  . Back surgery  2002  . Hernia repair  2005  . Tonsillectomy  1950's  . Cardiovascular stress test  2002    Adenosine Cardiolite with prior small inferior infarct, EF 59%  . Cardiovascular stress test  10/2013    lexiscan cardiolite - mod sized nontransmural infarct in inferior wall with small mild intensity peri-infarct ischemia, likely pulm HTN, akinesis of inferior wall with marked hypokinesis of LV and EF 27%  . Cardiac catheterization  10/2013    diffuse CAD, no sites for PCI, rec against CABG  . Pacemaker insertion  01/04/2014    [facility] [id number], serial [id number]  . US echocardiography  12/2013    at [place] - EF 25-35% with mod global hypokinesis   History  Substance Use Topics  . Smoking status: Former Smoker -- 0.80 packs/day    Types: Cigarettes  . Smokeless  tobacco: Never Used  . Alcohol Use: No   Family History  Problem Relation Age of Onset  . Heart disease Father     CHF  . Diabetes Paternal Aunt   . Diabetes Paternal Uncle   . Cancer Father     unsure type  . Stroke Neg Hx   . CAD Neg Hx      Review of Systems Per HPI unless specifically indicated above    Objective:    BP 136/100  Pulse 82  Temp(Src) 97.4 F (36.3 C) (Oral)  Wt 275 lb 8 oz (124.966 kg)  SpO2 97%  Physical Exam  Nursing note and vitals reviewed. Constitutional: He appears well-developed and well-nourished. No distress.  HENT:  Head: Normocephalic and atraumatic.  Mouth/Throat: Oropharynx is clear and moist. No oropharyngeal exudate.  Neck: Normal range of motion. Neck supple.  Cardiovascular: Normal rate, regular rhythm, normal heart sounds and intact distal pulses.   No murmur heard. Pulmonary/Chest: Effort normal. No respiratory distress. He has  no decreased breath sounds. He has no wheezes. He has no rales.  Crackles bibasilarly  Musculoskeletal: He exhibits no edema.  Lymphadenopathy:    He has no cervical adenopathy.  Skin: Skin is warm and dry. No rash noted.  Psychiatric: He has a normal mood and affect.       Assessment & Plan:   Problem List Items Addressed This Visit   Systolic CHF - Primary     Improved EF on latest echo 01/09/2014 showed EF 45-50% - s/p ICD for low output heart failure prior. Overall stable on current regimen, some bibasilar crackles along with noted weight trending up - discussed addition of low dose lasix and potassium if creatinine stable. Will refer to local cardiologist in [place] per patient preference - Dr. Heath Lark. We will fax today's note and labwork to him. Discussed benefits of establishing with local primary care doctor, pt opts to continue returning to [place] for routine care.    Relevant Medications      lisinopril (PRINIVIL,ZESTRIL) 20 MG tablet      aspirin 81 MG tablet       atorvastatin (LIPITOR) 80 MG tablet      sotalol (BETAPACE) tablet      furosemide (LASIX) tablet   Other Relevant Orders      Ambulatory referral to Cardiology      TSH (Completed)      CBC with Differential (Completed)      Renal function panel (Completed)   Pulmonary HTN   Relevant Medications      lisinopril (PRINIVIL,ZESTRIL) 20 MG tablet      aspirin 81 MG tablet      atorvastatin (LIPITOR) 80 MG tablet      sotalol (BETAPACE) tablet      furosemide (LASIX) tablet   Other Relevant Orders      Ambulatory referral to Cardiology   Pacemaker     Will be due for interrogation around 03/2014. States has appt set up for this.    Relevant Orders      Ambulatory referral to Cardiology   OSA (obstructive sleep apnea)     Again encouraged considering seeking treatment for sleep apnea.    HTN (hypertension)     Chronic, mildly elevated today but no changes made    Relevant Medications      lisinopril (PRINIVIL,ZESTRIL) 20 MG tablet      aspirin 81 MG tablet      atorvastatin (LIPITOR) 80 MG tablet      sotalol (BETAPACE) tablet      furosemide (LASIX) tablet   HLD (hyperlipidemia)     Chronic,continue statin.    Relevant Medications      lisinopril (PRINIVIL,ZESTRIL) 20 MG tablet      aspirin 81 MG tablet      atorvastatin (LIPITOR) 80 MG tablet      sotalol (BETAPACE) tablet      furosemide (LASIX) tablet   GI bleed     S/p discontinuation of xarelto and now solely on aspirin daily.    Ex-smoker     Congratulated on quitting smoking.    Chronic back pain     Take off narcotics in the hospital.    Relevant Medications      aspirin 81 MG tablet   CAD (coronary artery disease)   Relevant Medications      lisinopril (PRINIVIL,ZESTRIL) 20 MG tablet      aspirin 81 MG tablet      atorvastatin (LIPITOR) 80 MG tablet  sotalol (BETAPACE) tablet      furosemide (LASIX) tablet   Other Relevant Orders      Ambulatory referral to Cardiology   Atrial fibrillation      Off xarelto, only on aspirin 2/2 h/o GI bleed Now on sotalol 40mg  bid.    Relevant Medications      lisinopril (PRINIVIL,ZESTRIL) 20 MG tablet      aspirin 81 MG tablet      atorvastatin (LIPITOR) 80 MG tablet      sotalol (BETAPACE) tablet      furosemide (LASIX) tablet   Acute blood loss anemia     2/2 GI bleed w/ melena while on xarelto - recheck CBC today. Continue aspirin. EGD with evidence of duodenitis and gastritis. Pt states he also had colonoscopy with 10 polyps removed? I did not receive records of this.     Other Visit Diagnoses   Need for prophylactic vaccination and inoculation against influenza        Relevant Orders       Flu Vaccine QUAD 36+ mos PF IM (Fluarix Quad PF) (Completed)        Follow up plan: Return in about 3 months (around 05/14/2014), or as needed, for follow up visit.

## 2014-02-12 NOTE — Progress Notes (Signed)
Pre visit review using our clinic review tool, if applicable. No additional management support is needed unless otherwise documented below in the visit note. 

## 2014-02-13 ENCOUNTER — Encounter: Payer: Self-pay | Admitting: *Deleted

## 2014-02-14 ENCOUNTER — Encounter: Payer: Self-pay | Admitting: Family Medicine

## 2014-02-14 DIAGNOSIS — I480 Paroxysmal atrial fibrillation: Secondary | ICD-10-CM | POA: Insufficient documentation

## 2014-02-14 NOTE — Assessment & Plan Note (Signed)
Take off narcotics in the hospital.

## 2014-02-14 NOTE — Assessment & Plan Note (Signed)
Again encouraged considering seeking treatment for sleep apnea.

## 2014-02-14 NOTE — Assessment & Plan Note (Signed)
Chronic, continue statin. 

## 2014-02-14 NOTE — Assessment & Plan Note (Signed)
Will be due for interrogation around 03/2014. States has appt set up for this.

## 2014-02-14 NOTE — Assessment & Plan Note (Addendum)
Improved EF on latest echo 01/09/2014 showed EF 45-50% - s/p ICD for low output heart failure prior. Overall stable on current regimen, some bibasilar crackles along with noted weight trending up - discussed addition of low dose lasix and potassium if creatinine stable. Will refer to local cardiologist in [place] per patient preference - Dr. Heath Lark. We will fax today's note and labwork to him. Discussed benefits of establishing with local primary care doctor, pt opts to continue returning to [place] for routine care.

## 2014-02-14 NOTE — Assessment & Plan Note (Signed)
Congratulated on quitting smoking.   

## 2014-02-14 NOTE — Assessment & Plan Note (Signed)
Off xarelto, only on aspirin 2/2 h/o GI bleed Now on sotalol 40mg  bid.

## 2014-02-14 NOTE — Assessment & Plan Note (Signed)
S/p discontinuation of xarelto and now solely on aspirin daily.

## 2014-02-14 NOTE — Assessment & Plan Note (Signed)
Chronic, mildly elevated today but no changes made

## 2014-02-14 NOTE — Assessment & Plan Note (Addendum)
2/2 GI bleed w/ melena while on xarelto - recheck CBC today. Continue aspirin. EGD with evidence of duodenitis and gastritis. Pt states he also had colonoscopy with 10 polyps removed? I did not receive records of this.

## 2014-02-26 ENCOUNTER — Ambulatory Visit: Payer: Medicare HMO | Admitting: Family Medicine

## 2014-05-15 ENCOUNTER — Ambulatory Visit (INDEPENDENT_AMBULATORY_CARE_PROVIDER_SITE_OTHER): Payer: Medicare FFS | Admitting: Family Medicine

## 2014-05-15 ENCOUNTER — Encounter: Payer: Self-pay | Admitting: Family Medicine

## 2014-05-15 VITALS — BP 110/70 | HR 74 | Temp 98.1°F | Wt 282.5 lb

## 2014-05-15 DIAGNOSIS — I10 Essential (primary) hypertension: Secondary | ICD-10-CM

## 2014-05-15 DIAGNOSIS — E785 Hyperlipidemia, unspecified: Secondary | ICD-10-CM

## 2014-05-15 DIAGNOSIS — I2510 Atherosclerotic heart disease of native coronary artery without angina pectoris: Secondary | ICD-10-CM

## 2014-05-15 DIAGNOSIS — I5022 Chronic systolic (congestive) heart failure: Secondary | ICD-10-CM

## 2014-05-15 DIAGNOSIS — I4819 Other persistent atrial fibrillation: Secondary | ICD-10-CM

## 2014-05-15 DIAGNOSIS — G629 Polyneuropathy, unspecified: Secondary | ICD-10-CM

## 2014-05-15 DIAGNOSIS — Z79899 Other long term (current) drug therapy: Secondary | ICD-10-CM

## 2014-05-15 DIAGNOSIS — I481 Persistent atrial fibrillation: Secondary | ICD-10-CM

## 2014-05-15 LAB — RENAL FUNCTION PANEL
Albumin: 4.1 g/dL (ref 3.5–5.2)
BUN: 21 mg/dL (ref 6–23)
CALCIUM: 9.3 mg/dL (ref 8.4–10.5)
CO2: 23 meq/L (ref 19–32)
CREATININE: 1.4 mg/dL (ref 0.4–1.5)
Chloride: 109 mEq/L (ref 96–112)
GFR: 55.97 mL/min — ABNORMAL LOW (ref >60.00)
GLUCOSE: 124 mg/dL — AB (ref 70–99)
POTASSIUM: 4 meq/L (ref 3.5–5.1)
Phosphorus: 4.7 mg/dL — ABNORMAL HIGH (ref 2.3–4.6)
Sodium: 146 mEq/L — ABNORMAL HIGH (ref 135–145)

## 2014-05-15 LAB — LDL CHOLESTEROL, DIRECT: Direct LDL: 65.4 mg/dL

## 2014-05-15 LAB — FOLATE: Folate: >24.5 ng/mL (ref >5.9)

## 2014-05-15 LAB — VITAMIN B12: VITAMIN B 12: 370 pg/mL (ref 211–911)

## 2014-05-15 MED ORDER — OLANZAPINE 5 MG PO TABS: 5.0000 mg | ORAL_TABLET | Freq: Every day | ORAL | Status: DC

## 2014-05-15 NOTE — Patient Instructions (Addendum)
Have Dr Dimas Aguas send me his next office note (card provided today). Blood work today For leg pain start gabapentin 300mg  nightly, may increase to twice daily after 2 weeks as long as tolerating well. Return in 6 months for medicare wellness visit

## 2014-05-15 NOTE — Assessment & Plan Note (Signed)
Appreciate cards care of patient. Advised he buy scale for home and start monitoring weights regularly, bring log to next cardiology appointment. Pt states runs 280s on average, anticipate this is his dry weight.

## 2014-05-15 NOTE — Assessment & Plan Note (Signed)
Pt states cards said he had no significant blockages. I have requested latest cards note.

## 2014-05-15 NOTE — Progress Notes (Signed)
Pre visit review using our clinic review tool, if applicable. No additional management support is needed unless otherwise documented below in the visit note. 

## 2014-05-15 NOTE — Assessment & Plan Note (Signed)
Non drinker, no DM hx. Check B12 and folate today. Start gabapentin at 300mg  nightly and with option to increase to bid dosing, if tolerated well. Previously higher doses limited by ?bradycardia

## 2014-05-15 NOTE — Assessment & Plan Note (Signed)
Now on xarelto along with aspirin daily. Discussed monitoring for GI bleed - h/o this in past.

## 2014-05-15 NOTE — Assessment & Plan Note (Signed)
Check dLDL today as pt not fasting.

## 2014-05-15 NOTE — Assessment & Plan Note (Signed)
Chronic, stable. Continue regimen. 

## 2014-05-15 NOTE — Progress Notes (Signed)
BP 110/70 mmHg  Pulse 74  Temp(Src) 98.1 F (36.7 C) (Oral)  Wt 282 lb 8 oz (128.141 kg)   CC: 3 mo f/u  Subjective:    Patient ID: Patrick Garner, male    DOB: [birth year], 63 y.o.   MRN: [id number]  HPI: Patrick Garner is a 63 y.o. male presenting on 05/15/2014 for Follow-up   Lives in [place]. Recent complicated hospitalization for atrial fibrillation, sCHF, diffuse CAD with ischemic cardiomyopathy. See prior note for details.   He has established with Dr Heath Lark Cardiologist in [place]. Now referred to CHF specialist Dr Dimas Aguas. Next appointment is 06/19/2014. Has echo scheduled as well.   Had pacemaker interrogated 03/2014 by Dr Heath Lark.  States weight at home is 280s. Doesn't check weights at home but knows to avoid salt in diet. Denies chest pain, dyspnea.  Wants to start going to gym regularly.  Atrial fibrillation - on sotalol 40mg  bid and aspirin daily, no other anticoagulant 2/2 h/o GI bleed recently. Actually xarelto was recently started by cardiologist.   Worsening paresthesias - in hands and feet, worse at night time. Gabapentin was previously stopped 2/2 bradycardia at higher doses.  Relevant past medical, surgical, family and social history reviewed and updated as indicated. Interim medical history since our last visit reviewed. Allergies and medications reviewed and updated.  Current Outpatient Prescriptions on File Prior to Visit  Medication Sig  . aspirin 81 MG tablet Take 81 mg by mouth daily.  Marland Kitchen atorvastatin (LIPITOR) 80 MG tablet Take 80 mg by mouth daily.  . finasteride (PROSCAR) 5 MG tablet Take 1 tablet (5 mg total) by mouth daily.  . Fluticasone-Salmeterol (ADVAIR) 100-50 MCG/DOSE AEPB Inhale 1 puff into the lungs 2 (two) times daily. Takes one puff at night  . lisinopril (PRINIVIL,ZESTRIL) 20 MG tablet Take 20 mg by mouth daily.  . Multiple Vitamin (MULTIVITAMIN) tablet Take 1 tablet by mouth daily.  . pantoprazole (PROTONIX) 40 MG tablet  Take 1 tablet (40 mg total) by mouth 2 (two) times daily.  . potassium chloride (K-DUR,KLOR-CON) 10 MEQ tablet Take 1 tablet (10 mEq total) by mouth once. With lasix (Patient taking differently: Take 10 mEq by mouth 2 (two) times daily. With lasix)  . sotalol (BETAPACE) 80 MG tablet Take 0.5 tablets (40 mg total) by mouth 2 (two) times daily.  . fluticasone (FLONASE) 50 MCG/ACT nasal spray Place 1 spray into both nostrils daily.   No current facility-administered medications on file prior to visit.    Review of Systems Per HPI unless specifically indicated above     Objective:    BP 110/70 mmHg  Pulse 74  Temp(Src) 98.1 F (36.7 C) (Oral)  Wt 282 lb 8 oz (128.141 kg)  Wt Readings from Last 3 Encounters:  05/15/14 282 lb 8 oz (128.141 kg)  02/12/14 275 lb 8 oz (124.966 kg)  08/29/13 301 lb (136.533 kg)    Physical Exam  Constitutional: He appears well-developed and well-nourished. No distress.  Morbidly obese  HENT:  Mouth/Throat: Oropharynx is clear and moist. No oropharyngeal exudate.  Cardiovascular: Normal rate, regular rhythm, normal heart sounds and intact distal pulses.   No murmur heard. Pulmonary/Chest: Effort normal and breath sounds normal. No respiratory distress. He has no wheezes. He has no rales.  Musculoskeletal: He exhibits no edema.  Skin: Skin is warm and dry. No rash noted.  Psychiatric: He has a normal mood and affect.  Nursing note and vitals reviewed.  Assessment & Plan:   Problem List Items Addressed This Visit    Systolic CHF - Primary    Appreciate cards care of patient. Advised he buy scale for home and start monitoring weights regularly, bring log to next cardiology appointment. Pt states runs 280s on average, anticipate this is his dry weight.    Relevant Medications      furosemide (LASIX) 40 MG tablet      spironolactone (ALDACTONE) 25 MG tablet      carvedilol (COREG) 12.5 MG tablet      rivaroxaban (XARELTO) 20 MG TABS tablet    Other Relevant Orders      Renal function panel   Peripheral neuropathy    Non drinker, no DM hx. Check B12 and folate today. Start gabapentin at 300mg  nightly and with option to increase to bid dosing, if tolerated well. Previously higher doses limited by ?bradycardia    Relevant Medications      OLANZapine (ZYPREXA) tablet   Other Relevant Orders      Vitamin B12      Folate   HTN (hypertension)    Chronic, stable. Continue regimen.    Relevant Medications      furosemide (LASIX) 40 MG tablet      spironolactone (ALDACTONE) 25 MG tablet      carvedilol (COREG) 12.5 MG tablet      rivaroxaban (XARELTO) 20 MG TABS tablet   HLD (hyperlipidemia)    Check dLDL today as pt not fasting.    Relevant Medications      furosemide (LASIX) 40 MG tablet      spironolactone (ALDACTONE) 25 MG tablet      carvedilol (COREG) 12.5 MG tablet      rivaroxaban (XARELTO) 20 MG TABS tablet   Other Relevant Orders      LDL Cholesterol, Direct   CAD (coronary artery disease)    Pt states cards said he had no significant blockages. I have requested latest cards note.    Relevant Medications      furosemide (LASIX) 40 MG tablet      spironolactone (ALDACTONE) 25 MG tablet      carvedilol (COREG) 12.5 MG tablet      rivaroxaban (XARELTO) 20 MG TABS tablet   Atrial fibrillation    Now on xarelto along with aspirin daily. Discussed monitoring for GI bleed - h/o this in past.    Relevant Medications      furosemide (LASIX) 40 MG tablet      spironolactone (ALDACTONE) 25 MG tablet      carvedilol (COREG) 12.5 MG tablet      rivaroxaban (XARELTO) 20 MG TABS tablet       Follow up plan: Return in about 6 months (around 11/14/2014), or as needed, for medicare wellness.

## 2014-05-16 ENCOUNTER — Telehealth: Payer: Self-pay | Admitting: Family Medicine

## 2014-05-16 NOTE — Telephone Encounter (Signed)
emmi mailed  °

## 2014-05-17 ENCOUNTER — Encounter: Payer: Self-pay | Admitting: *Deleted

## 2014-06-19 ENCOUNTER — Other Ambulatory Visit: Payer: Self-pay | Admitting: Family Medicine

## 2014-08-14 ENCOUNTER — Other Ambulatory Visit: Payer: Self-pay | Admitting: Family Medicine

## 2014-11-14 ENCOUNTER — Encounter: Payer: Self-pay | Admitting: Family Medicine

## 2014-11-14 ENCOUNTER — Ambulatory Visit (INDEPENDENT_AMBULATORY_CARE_PROVIDER_SITE_OTHER): Payer: Medicare PPO | Admitting: Family Medicine

## 2014-11-14 VITALS — BP 110/70 | HR 64 | Temp 97.3°F | Ht 69.0 in | Wt 286.5 lb

## 2014-11-14 DIAGNOSIS — N138 Other obstructive and reflux uropathy: Secondary | ICD-10-CM

## 2014-11-14 DIAGNOSIS — I2510 Atherosclerotic heart disease of native coronary artery without angina pectoris: Secondary | ICD-10-CM

## 2014-11-14 DIAGNOSIS — N401 Enlarged prostate with lower urinary tract symptoms: Secondary | ICD-10-CM

## 2014-11-14 DIAGNOSIS — Z7189 Other specified counseling: Secondary | ICD-10-CM

## 2014-11-14 DIAGNOSIS — G8929 Other chronic pain: Secondary | ICD-10-CM

## 2014-11-14 DIAGNOSIS — I10 Essential (primary) hypertension: Secondary | ICD-10-CM

## 2014-11-14 DIAGNOSIS — I4819 Other persistent atrial fibrillation: Secondary | ICD-10-CM

## 2014-11-14 DIAGNOSIS — G4733 Obstructive sleep apnea (adult) (pediatric): Secondary | ICD-10-CM | POA: Diagnosis not present

## 2014-11-14 DIAGNOSIS — I5022 Chronic systolic (congestive) heart failure: Secondary | ICD-10-CM

## 2014-11-14 DIAGNOSIS — Z23 Encounter for immunization: Secondary | ICD-10-CM

## 2014-11-14 DIAGNOSIS — E785 Hyperlipidemia, unspecified: Secondary | ICD-10-CM | POA: Diagnosis not present

## 2014-11-14 DIAGNOSIS — G629 Polyneuropathy, unspecified: Secondary | ICD-10-CM

## 2014-11-14 DIAGNOSIS — M549 Dorsalgia, unspecified: Secondary | ICD-10-CM

## 2014-11-14 DIAGNOSIS — Z87891 Personal history of nicotine dependence: Secondary | ICD-10-CM

## 2014-11-14 DIAGNOSIS — Z0000 Encounter for general adult medical examination without abnormal findings: Secondary | ICD-10-CM

## 2014-11-14 DIAGNOSIS — Z1159 Encounter for screening for other viral diseases: Secondary | ICD-10-CM

## 2014-11-14 DIAGNOSIS — Z950 Presence of cardiac pacemaker: Secondary | ICD-10-CM

## 2014-11-14 DIAGNOSIS — Z0001 Encounter for general adult medical examination with abnormal findings: Secondary | ICD-10-CM

## 2014-11-14 LAB — LIPID PANEL
CHOLESTEROL: 124 mg/dL (ref 0–200)
HDL: 37.4 mg/dL — AB (ref >39.00)
NonHDL: 86.6
Total CHOL/HDL Ratio: 3
Triglycerides: 201 mg/dL — ABNORMAL HIGH (ref 0.0–149.0)
VLDL: 40.2 mg/dL — ABNORMAL HIGH (ref 0.0–40.0)

## 2014-11-14 LAB — COMPREHENSIVE METABOLIC PANEL
ALT: 12 U/L (ref 0–53)
AST: 15 U/L (ref 0–37)
Albumin: 4.1 g/dL (ref 3.5–5.2)
Alkaline Phosphatase: 110 U/L (ref 39–117)
BUN: 19 mg/dL (ref 6–23)
CHLORIDE: 104 meq/L (ref 96–112)
CO2: 33 meq/L — AB (ref 19–32)
Calcium: 9.5 mg/dL (ref 8.4–10.5)
Creatinine, Ser: 1.28 mg/dL (ref 0.40–1.50)
GFR: 59.93 mL/min — AB (ref >60.00)
Glucose, Bld: 99 mg/dL (ref 70–99)
Potassium: 4.4 mEq/L (ref 3.5–5.1)
Sodium: 142 mEq/L (ref 135–145)
Total Bilirubin: 1.8 mg/dL — ABNORMAL HIGH (ref 0.2–1.2)
Total Protein: 6.3 g/dL (ref 6.0–8.3)

## 2014-11-14 LAB — CBC WITH DIFFERENTIAL/PLATELET
BASOS PCT: 0.3 % (ref 0.0–3.0)
Basophils Absolute: 0 10*3/uL (ref 0.0–0.1)
Eosinophils Absolute: 0.3 10*3/uL (ref 0.0–0.7)
Eosinophils Relative: 3.7 % (ref 0.0–5.0)
HEMATOCRIT: 46.6 % (ref 39.0–52.0)
Hemoglobin: 15 g/dL (ref 13.0–17.0)
Lymphocytes Relative: 22.4 % (ref 12.0–46.0)
Lymphs Abs: 1.8 10*3/uL (ref 0.7–4.0)
MCHC: 32.2 g/dL (ref 30.0–36.0)
MCV: 83.4 fl (ref 78.0–100.0)
MONOS PCT: 5.3 % (ref 3.0–12.0)
Monocytes Absolute: 0.4 10*3/uL (ref 0.1–1.0)
NEUTROS PCT: 68.3 % (ref 43.0–77.0)
Neutro Abs: 5.6 10*3/uL (ref 1.4–7.7)
PLATELETS: 162 10*3/uL (ref 150.0–400.0)
RBC: 5.59 Mil/uL (ref 4.22–5.81)
RDW: 17.8 % — AB (ref 11.5–15.5)
WBC: 8.2 10*3/uL (ref 4.0–10.5)

## 2014-11-14 LAB — PSA: PSA: 0.12 ng/mL (ref 0.10–4.00)

## 2014-11-14 LAB — LDL CHOLESTEROL, DIRECT: Direct LDL: 58 mg/dL

## 2014-11-14 LAB — TSH: TSH: 2.79 u[IU]/mL (ref 0.35–4.50)

## 2014-11-14 MED ORDER — GABAPENTIN 300 MG PO CAPS
300.0000 mg | ORAL_CAPSULE | Freq: Two times a day (BID) | ORAL | Status: DC
Start: 2014-11-14 — End: 2016-02-05

## 2014-11-14 NOTE — Assessment & Plan Note (Signed)
Pacer in place.  

## 2014-11-14 NOTE — Progress Notes (Signed)
Pre visit review using our clinic review tool, if applicable. No additional management support is needed unless otherwise documented below in the visit note. 

## 2014-11-14 NOTE — Assessment & Plan Note (Signed)
Chronic, stable. Continue current regimen. 

## 2014-11-14 NOTE — Assessment & Plan Note (Signed)
Preventative protocols reviewed and updated unless pt declined. Discussed healthy diet and lifestyle.  

## 2014-11-14 NOTE — Assessment & Plan Note (Signed)
Check PSA - declines DRE.

## 2014-11-14 NOTE — Addendum Note (Signed)
Addended by: Ellamae Sia on: 11/14/2014 11:05 AM   Modules accepted: Orders

## 2014-11-14 NOTE — Progress Notes (Signed)
BP 110/70 mmHg  Pulse 64  Temp(Src) 97.3 F (36.3 C) (Oral)  Ht 5' 9" (1.753 m)  Wt 286 lb 8 oz (129.956 kg)  BMI 42.29 kg/m2   CC: medicare wellness visit  Subjective:    Patient ID: Patrick Garner, male    DOB: [birth year], 64 y.o.   MRN: [id number]  HPI: ELISANDRO JARRETT is a 64 y.o. male presenting on 11/14/2014 for Annual Exam   Lives in [place] 3 hr drive this morning. Hospitalization last year for afib, sCHF, diffuse CAD with ischemic CM. Has not started attending gym. Recently saw cardiologist 2 mo ago.   Atrial fibrillation - on sotalol 45m bid and aspirin daily, h/o GI bleed. Xarelto was recently started by cardiologist. Not weighing himself or bought scale.   Worsening paresthesias - in hands and feet, worse at night time. Gabapentin was previously stopped 2/2 bradycardia at higher doses. We restarted last visit 3063mbid if tolerated.  Passes hearing and vision screens today.  PHQ9 = 9.  High fall risk - 2 in last year without injury. Uses cane.  Preventative: Colon cancer screening - positive stool kit, states he had normal EGD and 10 polyps removed from colon[place]2013 [place]  Prostate cancer screening - pt does not want further prostate exams. Declines screening this year. Would be ok with PSA test.H/o BPH with latest PSA 0.27 (03/2011). Denies further sxs, proscar working well without nocturia.  Tetanus 2008.  Flu shot 02/2012  Pneumovax - 05/2012. prevnar today Shingles shot - declines Advanced directives: has living will at home. [place]. HCPOA - wife.   Caffeine: rare  Lives with wife, 1 cat  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   Relevant past medical, surgical, family and social history reviewed and updated  as indicated. Interim medical history since our last visit reviewed. Allergies and medications reviewed and updated. Current Outpatient Prescriptions on File Prior to Visit  Medication Sig  . aspirin 81 MG tablet Take 81 mg by mouth daily.  . Marland Kitchentorvastatin (LIPITOR) 80 MG tablet Take 80 mg by mouth daily.  . carvedilol (COREG) 12.5 MG tablet Take 12.5 mg by mouth 2 (two) times daily with a meal.  . finasteride (PROSCAR) 5 MG tablet TAKE ONE TABLET BY MOUTH DAILY  . fluticasone (FLONASE) 50 MCG/ACT nasal spray Place 1 spray into both nostrils daily.  . Fluticasone-Salmeterol (ADVAIR) 100-50 MCG/DOSE AEPB Inhale 1 puff into the lungs 2 (two) times daily. Takes one puff at night  . furosemide (LASIX) 40 MG tablet Take 40 mg by mouth 2 (two) times daily.  . Marland Kitchenisinopril (PRINIVIL,ZESTRIL) 20 MG tablet Take 20 mg by mouth daily.  . Multiple Vitamin (MULTIVITAMIN) tablet Take 1 tablet by mouth daily.  . Marland KitchenLANZapine (ZYPREXA) 5 MG tablet Take 1 tablet (5 mg total) by mouth daily.  . pantoprazole (PROTONIX) 40 MG tablet TAKE ONE TABLET BY MOUTH TWO TIMES A DAY  . potassium chloride (K-DUR,KLOR-CON) 10 MEQ tablet Take 1 tablet (10 mEq total) by mouth once. With lasix (Patient taking differently: Take 10 mEq by mouth 2 (two) times daily. With lasix)  . rivaroxaban (XARELTO) 20 MG TABS tablet Take 20 mg by mouth daily with supper.  . sotalol (BETAPACE) 80 MG tablet TAKE 1/2 TABLET TWICE  DAILY  . spironolactone (ALDACTONE) 25 MG tablet Take 25 mg by mouth daily.   No current facility-administered medications on file prior to visit.    Review of Systems  Constitutional: Negative for fever, chills, activity change, appetite change, fatigue and unexpected weight change.  HENT: Negative for hearing loss.   Eyes: Negative for visual disturbance.  Respiratory: Negative for cough, chest tightness, shortness of breath and wheezing.   Cardiovascular: Negative for chest pain, palpitations and leg swelling.    Gastrointestinal: Negative for nausea, vomiting, abdominal pain, diarrhea, constipation, blood in stool and abdominal distention.  Genitourinary: Negative for hematuria and difficulty urinating.  Musculoskeletal: Negative for myalgias, arthralgias and neck pain.  Skin: Negative for rash.  Neurological: Negative for dizziness, seizures, syncope and headaches.  Hematological: Negative for adenopathy. Does not bruise/bleed easily.  Psychiatric/Behavioral: Negative for dysphoric mood. The patient is not nervous/anxious.    Per HPI unless specifically indicated above     Objective:    BP 110/70 mmHg  Pulse 64  Temp(Src) 97.3 F (36.3 C) (Oral)  Ht 5' 9" (1.753 m)  Wt 286 lb 8 oz (129.956 kg)  BMI 42.29 kg/m2  Wt Readings from Last 3 Encounters:  11/14/14 286 lb 8 oz (129.956 kg)  05/15/14 282 lb 8 oz (128.141 kg)  02/12/14 275 lb 8 oz (124.966 kg)    Physical Exam  Constitutional: He is oriented to person, place, and time. He appears well-developed and well-nourished. No distress.  HENT:  Head: Normocephalic and atraumatic.  Right Ear: Hearing, tympanic membrane, external ear and ear canal normal.  Left Ear: Hearing, tympanic membrane, external ear and ear canal normal.  Nose: Nose normal.  Mouth/Throat: Uvula is midline, oropharynx is clear and moist and mucous membranes are normal. No oropharyngeal exudate, posterior oropharyngeal edema or posterior oropharyngeal erythema.  Eyes: Conjunctivae and EOM are normal. Pupils are equal, round, and reactive to light. No scleral icterus.  Neck: Normal range of motion. Neck supple. Carotid bruit is not present. No thyromegaly present.  Cardiovascular: Normal rate, regular rhythm, normal heart sounds and intact distal pulses.   No murmur heard. Pulses:      Radial pulses are 2+ on the right side, and 2+ on the left side.  Pulmonary/Chest: Effort normal and breath sounds normal. No respiratory distress. He has no wheezes. He has no rales.   Abdominal: Soft. Bowel sounds are normal. He exhibits no distension and no mass. There is no tenderness. There is no rebound and no guarding.  Musculoskeletal: Normal range of motion. He exhibits no edema.  Lymphadenopathy:    He has no cervical adenopathy.  Neurological: He is alert and oriented to person, place, and time.  CN grossly intact, station and gait intact Recall 3/3 Calculation 4/5 serial 3s  Skin: Skin is warm and dry. No rash noted.  Psychiatric: He has a normal mood and affect. His behavior is normal. Judgment and thought content normal.  Nursing note and vitals reviewed.      Assessment & Plan:   Problem List Items Addressed This Visit    Advanced care planning/counseling discussion    Advanced directives: has living will at home. Asked to mail us copy. HCPOA - wife.       Atrial fibrillation    Chronic, stable. Sounds regular today. Continue sotalol, aspirin, xarelto. Followed regularly by CHF MD in [place]      BPH (benign prostatic hypertrophy) with urinary obstruction    Check PSA - declines DRE.        Relevant Orders   PSA   CAD (coronary artery disease)    Still awaiting records.      Chronic back pain    Now off narcotics (d/c during hospitalization)      Ex-smoker    Encouraged continued abstinence.      Health maintenance examination    Preventative protocols reviewed and updated unless pt declined. Discussed healthy diet and lifestyle.       HLD (hyperlipidemia)    Check FLP today.      Relevant Orders   Lipid panel   Comprehensive metabolic panel   HTN (hypertension)    Chronic, stable. Continue current regimen.      Relevant Orders   Comprehensive metabolic panel   TSH   Medicare annual wellness visit, subsequent - Primary    I have personally reviewed the Medicare Annual Wellness questionnaire and have noted 1. The patient's medical and social history 2. Their use of alcohol, tobacco or illicit drugs 3. Their current  medications and supplements 4. The patient's functional ability including ADL's, fall risks, home safety risks and hearing or visual impairment. 5. Diet and physical activity 6. Evidence for depression or mood disorders The patients weight, height, BMI have been recorded in the chart.  Hearing and vision has been addressed. I have made referrals, counseling and provided education to the patient based review of the above and I have provided the pt with a written personalized care plan for preventive services. Provider list updated - see scanned questionairre. Reviewed preventative protocols and updated unless pt declined.       OSA (obstructive sleep apnea)    Not treated.      Relevant Orders   CBC with Differential/Platelet   Pacemaker    Pacer in place.      Peripheral neuropathy    Gabapentin helpful - will increase to 328m bid - advised monitor for bradycardia.      Relevant Medications   gabapentin (NEURONTIN) 300 MG capsule   Severe obesity (BMI >= 40)    Discussed regularly weighing himself.      Systolic CHF    Followed by CHF clinic. Encouraged avoidance of sodium in diet, buy scale to start weighing himself daily.      Relevant Orders   CBC with Differential/Platelet    Other Visit Diagnoses    Need for hepatitis C screening test        Relevant Orders    Hepatitis C Ab Reflex HCV RNA, QUANT        Follow up plan: Return in about 6 months (around 05/16/2015), or as needed, for follow up visit.

## 2014-11-14 NOTE — Assessment & Plan Note (Signed)
Followed by CHF clinic. Encouraged avoidance of sodium in diet, buy scale to start weighing himself daily.

## 2014-11-14 NOTE — Addendum Note (Signed)
Addended by: Marchia Bond on: 11/14/2014 03:42 PM   Modules accepted: Miquel Dunn

## 2014-11-14 NOTE — Assessment & Plan Note (Signed)
Discussed regularly weighing himself.

## 2014-11-14 NOTE — Assessment & Plan Note (Signed)
Chronic, stable. Sounds regular today. Continue sotalol, aspirin, xarelto. Followed regularly by CHF MD in [facility]

## 2014-11-14 NOTE — Assessment & Plan Note (Signed)
Advanced directives: has living will at home. Asked to mail [place] copy. HCPOA - wife.

## 2014-11-14 NOTE — Assessment & Plan Note (Signed)
Not treated

## 2014-11-14 NOTE — Assessment & Plan Note (Signed)
Still awaiting records

## 2014-11-14 NOTE — Assessment & Plan Note (Signed)
Now off narcotics (d/c during hospitalization)

## 2014-11-14 NOTE — Assessment & Plan Note (Signed)

## 2014-11-14 NOTE — Assessment & Plan Note (Signed)
Check FLP today. 

## 2014-11-14 NOTE — Addendum Note (Signed)
Addended by: Royann Shivers A on: 11/14/2014 09:40 AM   Modules accepted: Orders

## 2014-11-14 NOTE — Patient Instructions (Addendum)
prevnar today labwork today. Let's increase gabapentin to 300mg  twice daily - new dose at pharmacy. Watch for slow heart rate. Pass by front office to sign release for colonoscopy report. Return as needed or in 6 months for follow up viist.

## 2014-11-14 NOTE — Assessment & Plan Note (Signed)
Encouraged continued abstinence. 

## 2014-11-14 NOTE — Assessment & Plan Note (Signed)
Gabapentin helpful - will increase to 300mg  bid - advised monitor for bradycardia.

## 2014-11-15 LAB — HEPATITIS C ANTIBODY: HCV AB: NEGATIVE

## 2014-11-19 ENCOUNTER — Encounter: Payer: Self-pay | Admitting: Family Medicine

## 2014-11-19 ENCOUNTER — Encounter: Payer: Self-pay | Admitting: *Deleted

## 2014-11-19 DIAGNOSIS — Z95810 Presence of automatic (implantable) cardiac defibrillator: Secondary | ICD-10-CM | POA: Insufficient documentation

## 2015-02-20 ENCOUNTER — Other Ambulatory Visit: Payer: Self-pay | Admitting: Family Medicine

## 2015-03-15 ENCOUNTER — Other Ambulatory Visit: Payer: Self-pay | Admitting: Family Medicine

## 2015-05-06 ENCOUNTER — Other Ambulatory Visit: Payer: Self-pay | Admitting: Family Medicine

## 2015-05-07 NOTE — Telephone Encounter (Signed)
Ok to refill 

## 2015-05-21 ENCOUNTER — Ambulatory Visit: Payer: Medicare PPO | Admitting: Family Medicine

## 2015-06-19 ENCOUNTER — Ambulatory Visit (INDEPENDENT_AMBULATORY_CARE_PROVIDER_SITE_OTHER): Payer: Medicare PPO | Admitting: Family Medicine

## 2015-06-19 ENCOUNTER — Encounter: Payer: Self-pay | Admitting: Family Medicine

## 2015-06-19 VITALS — BP 124/76 | HR 68 | Temp 97.5°F | Wt 297.5 lb

## 2015-06-19 DIAGNOSIS — I272 Other secondary pulmonary hypertension: Secondary | ICD-10-CM | POA: Diagnosis not present

## 2015-06-19 DIAGNOSIS — Z23 Encounter for immunization: Secondary | ICD-10-CM | POA: Diagnosis not present

## 2015-06-19 DIAGNOSIS — I5022 Chronic systolic (congestive) heart failure: Secondary | ICD-10-CM

## 2015-06-19 DIAGNOSIS — G4700 Insomnia, unspecified: Secondary | ICD-10-CM | POA: Diagnosis not present

## 2015-06-19 DIAGNOSIS — G6289 Other specified polyneuropathies: Secondary | ICD-10-CM

## 2015-06-19 DIAGNOSIS — I480 Paroxysmal atrial fibrillation: Secondary | ICD-10-CM

## 2015-06-19 DIAGNOSIS — I10 Essential (primary) hypertension: Secondary | ICD-10-CM

## 2015-06-19 LAB — COMPREHENSIVE METABOLIC PANEL
ALT: 12 U/L (ref 0–53)
AST: 15 U/L (ref 0–37)
Albumin: 3.9 g/dL (ref 3.5–5.2)
Alkaline Phosphatase: 112 U/L (ref 39–117)
BILIRUBIN TOTAL: 1.1 mg/dL (ref 0.2–1.2)
BUN: 15 mg/dL (ref 6–23)
CO2: 31 meq/L (ref 19–32)
Calcium: 9.3 mg/dL (ref 8.4–10.5)
Chloride: 105 mEq/L (ref 96–112)
Creatinine, Ser: 1.42 mg/dL (ref 0.40–1.50)
GFR: 53.06 mL/min — ABNORMAL LOW (ref >60.00)
GLUCOSE: 146 mg/dL — AB (ref 70–99)
Potassium: 4.3 mEq/L (ref 3.5–5.1)
Sodium: 145 mEq/L (ref 135–145)
TOTAL PROTEIN: 6.3 g/dL (ref 6.0–8.3)

## 2015-06-19 LAB — BRAIN NATRIURETIC PEPTIDE: PRO B NATRI PEPTIDE: 48 pg/mL (ref 0.0–100.0)

## 2015-06-19 NOTE — Assessment & Plan Note (Signed)
Today sounds regular. Continue sotalol, aspirin, xarelto.

## 2015-06-19 NOTE — Progress Notes (Signed)
BP 124/76 mmHg  Pulse 68  Temp(Src) 97.5 F (36.4 C) (Oral)  Wt 297 lb 8 oz (134.945 kg)   CC: 6 mo f/u visit  Subjective:    Patient ID: Patrick Garner, male    DOB: 28-May-1951, 65 y.o.   MRN: [id number]  HPI: Patrick Garner is a 65 y.o. male presenting on 06/19/2015 for Follow-up   Lives in [place] - 3 hr drive to get here.  Hospitalization 2015 for afib, sCHF, diffuse CAD with iCM and pacer. H/o GI bleed.   Atrial fibrillation - followed by cardiologist. On sotalol 40mg  bid and aspirin and xarelto daily.   sCHF - 11lb weight gain over last 6 months. Some increased dyspnea on exertion (mainly noted going up hill).   Paresthesias - better controlled on gabapentin 300mg  bid.   Overall feels well. Denies dyspnea, wheezing, cough.   On zyprexa nightly - but unsure if needed. Bedtime 6pm, sleeps well. No agitation, anxiety, depression.   Some trouble with stool urgency leading to accidents - twice this week, less frequently prior. No diarrhea, fevers/chills, abd pain. Recent colonoscopy 2015 per report with 10 polyps removed but otherwise ok.   Relevant past medical, surgical, family and social history reviewed and updated as indicated. Interim medical history since our last visit reviewed. Allergies and medications reviewed and updated. Current Outpatient Prescriptions on File Prior to Visit  Medication Sig  . aspirin 81 MG tablet Take 81 mg by mouth daily.  Marland Kitchen atorvastatin (LIPITOR) 80 MG tablet Take 80 mg by mouth daily.  . carvedilol (COREG) 12.5 MG tablet Take 12.5 mg by mouth 2 (two) times daily with a meal.  . finasteride (PROSCAR) 5 MG tablet TAKE ONE TABLET BY MOUTH DAILY  . fluticasone (FLONASE) 50 MCG/ACT nasal spray Place 1 spray into both nostrils daily. Reported on 06/19/2015  . furosemide (LASIX) 40 MG tablet Take 40 mg by mouth 2 (two) times daily.  Marland Kitchen gabapentin (NEURONTIN) 300 MG capsule Take 1 capsule (300 mg total) by mouth 2 (two) times daily.  Marland Kitchen lisinopril  (PRINIVIL,ZESTRIL) 20 MG tablet Take 20 mg by mouth daily.  . Multiple Vitamin (MULTIVITAMIN) tablet Take 1 tablet by mouth daily.  . pantoprazole (PROTONIX) 40 MG tablet TAKE ONE TABLET BY MOUTH TWICE A DAY  . rivaroxaban (XARELTO) 20 MG TABS tablet Take 20 mg by mouth daily with supper.  . sotalol (BETAPACE) 80 MG tablet TAKE 1/2 TABLET BY MOUTH TWO TIMES A DAY  . spironolactone (ALDACTONE) 25 MG tablet Take 25 mg by mouth daily.  . Fluticasone-Salmeterol (ADVAIR) 100-50 MCG/DOSE AEPB Inhale 1 puff into the lungs 2 (two) times daily. Reported on 06/19/2015   No current facility-administered medications on file prior to visit.    Review of Systems Per HPI unless specifically indicated in ROS section     Objective:    BP 124/76 mmHg  Pulse 68  Temp(Src) 97.5 F (36.4 C) (Oral)  Wt 297 lb 8 oz (134.945 kg)  Wt Readings from Last 3 Encounters:  06/19/15 297 lb 8 oz (134.945 kg)  11/14/14 286 lb 8 oz (129.956 kg)  05/15/14 282 lb 8 oz (128.141 kg)   Body mass index is 43.91 kg/(m^2).  Physical Exam  Constitutional: He appears well-developed and well-nourished. No distress.  HENT:  Mouth/Throat: Oropharynx is clear and moist. No oropharyngeal exudate.  Cardiovascular: Normal rate, regular rhythm, normal heart sounds and intact distal pulses.   No murmur heard. Pulmonary/Chest: Effort normal and breath sounds normal.  No respiratory distress. He has no wheezes. He has no rales.  Abdominal: Soft. Normal appearance and bowel sounds are normal. He exhibits no distension and no mass. There is no tenderness. There is no rigidity, no rebound, no guarding, no CVA tenderness and negative Murphy's sign.  obese  Musculoskeletal: He exhibits no edema.  Skin: Skin is warm and dry.  Psychiatric: He has a normal mood and affect.  Nursing note and vitals reviewed.  Results for orders placed or performed in visit on 11/14/14  CBC with Differential/Platelet  Result Value Ref Range   WBC 8.2 4.0 -  10.5 K/uL   RBC 5.59 4.22 - 5.81 Mil/uL   Hemoglobin 15.0 13.0 - 17.0 g/dL   HCT 46.6 39.0 - 52.0 %   MCV 83.4 78.0 - 100.0 fl   MCHC 32.2 30.0 - 36.0 g/dL   RDW 17.8 (H) 11.5 - 15.5 %   Platelets 162.0 150.0 - 400.0 K/uL   Neutrophils Relative % 68.3 43.0 - 77.0 %   Lymphocytes Relative 22.4 12.0 - 46.0 %   Monocytes Relative 5.3 3.0 - 12.0 %   Eosinophils Relative 3.7 0.0 - 5.0 %   Basophils Relative 0.3 0.0 - 3.0 %   Neutro Abs 5.6 1.4 - 7.7 K/uL   Lymphs Abs 1.8 0.7 - 4.0 K/uL   Monocytes Absolute 0.4 0.1 - 1.0 K/uL   Eosinophils Absolute 0.3 0.0 - 0.7 K/uL   Basophils Absolute 0.0 0.0 - 0.1 K/uL  Hepatitis C Antibody  Result Value Ref Range   HCV Ab NEGATIVE NEGATIVE  Lipid panel  Result Value Ref Range   Cholesterol 124 0 - 200 mg/dL   Triglycerides 201.0 (H) 0.0 - 149.0 mg/dL   HDL 37.40 (L) >39.00 mg/dL   VLDL 40.2 (H) 0.0 - 40.0 mg/dL   Total CHOL/HDL Ratio 3    NonHDL 86.60   Comprehensive metabolic panel  Result Value Ref Range   Sodium 142 135 - 145 mEq/L   Potassium 4.4 3.5 - 5.1 mEq/L   Chloride 104 96 - 112 mEq/L   CO2 33 (H) 19 - 32 mEq/L   Glucose, Bld 99 70 - 99 mg/dL   BUN 19 6 - 23 mg/dL   Creatinine, Ser 1.28 0.40 - 1.50 mg/dL   Total Bilirubin 1.8 (H) 0.2 - 1.2 mg/dL   Alkaline Phosphatase 110 39 - 117 U/L   AST 15 0 - 37 U/L   ALT 12 0 - 53 U/L   Total Protein 6.3 6.0 - 8.3 g/dL   Albumin 4.1 3.5 - 5.2 g/dL   Calcium 9.5 8.4 - 10.5 mg/dL   GFR 59.93 (L) >60.00 mL/min  TSH  Result Value Ref Range   TSH 2.79 0.35 - 4.50 uIU/mL  PSA  Result Value Ref Range   PSA 0.12 0.10 - 4.00 ng/mL  LDL cholesterol, direct  Result Value Ref Range   Direct LDL 58.0 mg/dL      Assessment & Plan:   Problem List Items Addressed This Visit    Systolic CHF ([place]) - Primary    Weight gain noted. No significant pedal edema or evidence of pulm congestion on exam today. Check BNP, CMP today then titrate lasix accordingly.       Relevant Orders    Comprehensive metabolic panel   Brain natriuretic peptide   Severe obesity (BMI >= 40) (HCC)    Discussed weight gain noted. See above. Encouraged increased activity.       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       Peripheral neuropathy (HCC)    Stable. Continue gabapentin 300mg  BID. No bradycardia noted.      Paroxysmal atrial fibrillation (HCC)    Today sounds regular. Continue sotalol, aspirin, xarelto.       Insomnia    Was on zyprexa from 2015 hospitalization. unclear if still needed - will D/C, monitor mood and sleep and update me if persistent sxs - consider trazodone.       HTN (hypertension)    Chronic, stable. Continue current regimen.          Follow up plan: Return in about 6 months (around 12/17/2015), or as needed, for medicare wellness visit.

## 2015-06-19 NOTE — Assessment & Plan Note (Signed)
Was on zyprexa from 2015 hospitalization. unclear if still needed - will D/C, monitor mood and sleep and update me if persistent sxs - consider trazodone.

## 2015-06-19 NOTE — Assessment & Plan Note (Addendum)
Discussed weight gain noted. See above. Encouraged increased activity.

## 2015-06-19 NOTE — Assessment & Plan Note (Signed)
Weight gain noted. No significant pedal edema or evidence of pulm congestion on exam today. Check BNP, CMP today then titrate lasix accordingly.

## 2015-06-19 NOTE — Assessment & Plan Note (Signed)
Stable report at latest cardiology appointment.

## 2015-06-19 NOTE — Patient Instructions (Addendum)
Flu shot today Let's stop olanzapine, monitor mood and sleep. If trouble sleeping off medicine, let me know to try something else.  Blood work today. Return 5-6 months medicare wellness visit.

## 2015-06-19 NOTE — Assessment & Plan Note (Signed)
Stable. Continue gabapentin 300mg  BID. No bradycardia noted.

## 2015-06-19 NOTE — Addendum Note (Signed)
Addended by: Royann Shivers A on: 06/19/2015 08:48 AM   Modules accepted: Orders

## 2015-06-19 NOTE — Assessment & Plan Note (Signed)
Chronic, stable. Continue current regimen. 

## 2015-06-19 NOTE — Progress Notes (Signed)
Pre visit review using our clinic review tool, if applicable. No additional management support is needed unless otherwise documented below in the visit note. 

## 2015-11-13 ENCOUNTER — Other Ambulatory Visit: Payer: Self-pay | Admitting: Family Medicine

## 2015-11-19 ENCOUNTER — Other Ambulatory Visit: Payer: Self-pay | Admitting: Family Medicine

## 2015-12-18 ENCOUNTER — Other Ambulatory Visit: Payer: Self-pay | Admitting: Family Medicine

## 2015-12-18 ENCOUNTER — Ambulatory Visit: Payer: Medicare PPO | Admitting: Family Medicine

## 2015-12-27 ENCOUNTER — Ambulatory Visit (INDEPENDENT_AMBULATORY_CARE_PROVIDER_SITE_OTHER): Payer: Medicare PPO | Admitting: Family Medicine

## 2015-12-27 ENCOUNTER — Encounter: Payer: Self-pay | Admitting: Family Medicine

## 2015-12-27 VITALS — BP 110/70 | HR 78 | Ht 69.0 in | Wt 298.2 lb

## 2015-12-27 DIAGNOSIS — M25512 Pain in left shoulder: Secondary | ICD-10-CM

## 2015-12-27 DIAGNOSIS — G4700 Insomnia, unspecified: Secondary | ICD-10-CM

## 2015-12-27 DIAGNOSIS — I5022 Chronic systolic (congestive) heart failure: Secondary | ICD-10-CM

## 2015-12-27 DIAGNOSIS — G6289 Other specified polyneuropathies: Secondary | ICD-10-CM | POA: Diagnosis not present

## 2015-12-27 DIAGNOSIS — I480 Paroxysmal atrial fibrillation: Secondary | ICD-10-CM

## 2015-12-27 DIAGNOSIS — I10 Essential (primary) hypertension: Secondary | ICD-10-CM

## 2015-12-27 NOTE — Assessment & Plan Note (Signed)
Continue sotalol, xarelto 

## 2015-12-27 NOTE — Assessment & Plan Note (Signed)
Continue gabapentin 300mg BID.

## 2015-12-27 NOTE — Assessment & Plan Note (Signed)
Anticipate RTC tendonitis - treat with tylenol, heating pad, exercises provided today. Avoid NSAIDs. If no better, pt will contact me for referral to local ortho in [place].

## 2015-12-27 NOTE — Progress Notes (Signed)
Pre visit review using our clinic review tool, if applicable. No additional management support is needed unless otherwise documented below in the visit note. 

## 2015-12-27 NOTE — Assessment & Plan Note (Signed)
Chronic, stable. Continue current regimen. 

## 2015-12-27 NOTE — Assessment & Plan Note (Signed)
Appreciate cards care of patient. Continue current regimen. Seems euvolemic today. Not fasting today.

## 2015-12-27 NOTE — Assessment & Plan Note (Signed)
Continue zyprexa. Suggested add benadryl prn.

## 2015-12-27 NOTE — Progress Notes (Signed)
BP 110/70 mmHg  Pulse 78  Ht 5\' 9"  (1.753 m)  Wt 298 lb 3.2 oz (135.263 kg)  BMI 44.02 kg/m2  SpO2 92%   CC: 6 mo f/u visit Subjective:    Patient ID: Patrick Garner, male    DOB: June 24, 1950, 65 y.o.   MRN: [id number]  HPI: Patrick Garner is a 65 y.o. male presenting on 12/27/2015 for Follow-up   Lives in [place] - 3 hr drive to get here.  Hospitalization 2015 for afib, sCHF, diffuse CAD with iCM and pacer. H/o GI bleed s/p colonoscopy.   Last visit we stopped zyprexa. Wife noted mood change - more angry, irritable, "mean". Pt hasn't noticed any change. Noticing more trouble with sleeping despite zyprexa.   Ongoing L arm pain over last 3 months - unable to lift above head. Denies inciting trauma. Entire shoulder hurts.   Ongoing BLE paresthesias to mid calf. On gabapentin 300mg  bid for this.   Recent stable cardiac evaluation.  Wife endorses he is not exercising at all. Pt endorses worsening dyspnea on exertion over last 3 months.   Relevant past medical, surgical, family and social history reviewed and updated as indicated. Interim medical history since our last visit reviewed. Allergies and medications reviewed and updated. Current Outpatient Prescriptions on File Prior to Visit  Medication Sig  . aspirin 81 MG tablet Take 81 mg by mouth daily.  Marland Kitchen atorvastatin (LIPITOR) 80 MG tablet Take 80 mg by mouth daily.  . carvedilol (COREG) 12.5 MG tablet Take 12.5 mg by mouth 2 (two) times daily with a meal.  . finasteride (PROSCAR) 5 MG tablet TAKE ONE TABLET BY MOUTH DAILY  . furosemide (LASIX) 40 MG tablet Take 40 mg by mouth 2 (two) times daily.  Marland Kitchen gabapentin (NEURONTIN) 300 MG capsule Take 1 capsule (300 mg total) by mouth 2 (two) times daily.  Marland Kitchen lisinopril (PRINIVIL,ZESTRIL) 20 MG tablet Take 20 mg by mouth daily.  . Multiple Vitamin (MULTIVITAMIN) tablet Take 1 tablet by mouth daily.  . pantoprazole (PROTONIX) 40 MG tablet TAKE ONE TABLET BY MOUTH TWICE A DAY  . potassium  chloride (K-DUR,KLOR-CON) 10 MEQ tablet Take 10 mEq by mouth 2 (two) times daily.  . rivaroxaban (XARELTO) 20 MG TABS tablet Take 20 mg by mouth daily with supper.  . sotalol (BETAPACE) 80 MG tablet TAKE ONE-HALF TABLET BY MOUTH TWICE DAILY  . spironolactone (ALDACTONE) 25 MG tablet Take 25 mg by mouth daily.   No current facility-administered medications on file prior to visit.    Review of Systems Per HPI unless specifically indicated in ROS section     Objective:    BP 110/70 mmHg  Pulse 78  Ht 5\' 9"  (1.753 m)  Wt 298 lb 3.2 oz (135.263 kg)  BMI 44.02 kg/m2  SpO2 92%  Wt Readings from Last 3 Encounters:  12/27/15 298 lb 3.2 oz (135.263 kg)  06/19/15 297 lb 8 oz (134.945 kg)  11/14/14 286 lb 8 oz (129.956 kg)    Physical Exam  Constitutional: He appears well-developed and well-nourished. No distress.  Walks with walker  HENT:  Mouth/Throat: Oropharynx is clear and moist. No oropharyngeal exudate.  Cardiovascular: Normal rate, regular rhythm, normal heart sounds and intact distal pulses.   No murmur heard. Pulmonary/Chest: Effort normal and breath sounds normal. No respiratory distress. He has no wheezes. He has no rales.  Musculoskeletal: He exhibits no edema.  FROM at neck  L shoulder WNL R Shoulder exam: No deformity of shoulders  on inspection. Discomfort with palpation of shoulder landmarks. FROM in abduction and forward flexion, pain at extremes. Pain with testing SITS in ext/int rotation. Pain with empty can sign. No significant impingement. No significant pain with rotation of humeral head in [place] joint.   Skin: Skin is warm and dry. No rash noted.  Psychiatric: He has a normal mood and affect.  Nursing note and vitals reviewed.  Maintains O2 sat at 93% on ambulatory pulse ox today.    Assessment & Plan:   Problem List Items Addressed This Visit    HTN (hypertension)    Chronic, stable. Continue current regimen.       Peripheral neuropathy (HCC)     Continue gabapentin 300mg  BID.      Relevant Medications   OLANZapine (ZYPREXA) 5 MG tablet   Insomnia    Continue zyprexa. Suggested add benadryl prn.       HFrEF (heart failure with reduced ejection fraction) ([place])    Appreciate cards care of patient. Continue current regimen. Seems euvolemic today. Not fasting today.       Paroxysmal atrial fibrillation (HCC)    Continue sotalol, xarelto.       Left shoulder pain - Primary    Anticipate RTC tendonitis - treat with tylenol, heating pad, exercises provided today. Avoid NSAIDs. If no better, pt will contact me for referral to local ortho in [place].          Follow up plan: Return in about 6 months (around 06/28/2016), or as needed, for medicare wellness visit.  Ria Bush, MD

## 2015-12-27 NOTE — Patient Instructions (Addendum)
Continue current medicines Try exercises for shoulder. May use tylenol and heating pad for pain. If no improvement let me know for referral to orthopedist.  Return in 6 months for fasting labs and medicare wellness visit Try benadryl for sleep.

## 2016-02-05 ENCOUNTER — Other Ambulatory Visit: Payer: Self-pay | Admitting: Family Medicine

## 2016-05-12 ENCOUNTER — Other Ambulatory Visit: Payer: Self-pay | Admitting: Family Medicine

## 2016-05-21 ENCOUNTER — Other Ambulatory Visit: Payer: Self-pay | Admitting: Family Medicine

## 2016-05-21 NOTE — Telephone Encounter (Signed)
Request status of refill; advised sent sotalol electronically today. Pt wife voiced understanding.

## 2016-05-27 ENCOUNTER — Other Ambulatory Visit: Payer: Self-pay | Admitting: Family Medicine

## 2016-06-22 ENCOUNTER — Other Ambulatory Visit: Payer: Self-pay | Admitting: Family Medicine

## 2016-06-22 NOTE — Telephone Encounter (Signed)
pts wife (DPR signed) request status of finasteride refill to [facility]. Advised Mrs Carmona refill already done and she voiced understanding.

## 2016-06-28 ENCOUNTER — Other Ambulatory Visit: Payer: Self-pay | Admitting: Family Medicine

## 2016-06-28 DIAGNOSIS — E785 Hyperlipidemia, unspecified: Secondary | ICD-10-CM

## 2016-06-28 DIAGNOSIS — N138 Other obstructive and reflux uropathy: Secondary | ICD-10-CM

## 2016-06-28 DIAGNOSIS — N401 Enlarged prostate with lower urinary tract symptoms: Secondary | ICD-10-CM

## 2016-06-28 DIAGNOSIS — R739 Hyperglycemia, unspecified: Secondary | ICD-10-CM

## 2016-06-30 ENCOUNTER — Other Ambulatory Visit: Payer: Medicare PPO

## 2016-06-30 ENCOUNTER — Ambulatory Visit (INDEPENDENT_AMBULATORY_CARE_PROVIDER_SITE_OTHER): Payer: Medicare PPO

## 2016-06-30 ENCOUNTER — Encounter: Payer: Self-pay | Admitting: Family Medicine

## 2016-06-30 ENCOUNTER — Ambulatory Visit (INDEPENDENT_AMBULATORY_CARE_PROVIDER_SITE_OTHER): Payer: Medicare PPO | Admitting: Family Medicine

## 2016-06-30 VITALS — BP 118/72 | HR 56 | Temp 97.6°F | Ht 67.5 in | Wt 295.8 lb

## 2016-06-30 DIAGNOSIS — N401 Enlarged prostate with lower urinary tract symptoms: Secondary | ICD-10-CM

## 2016-06-30 DIAGNOSIS — N138 Other obstructive and reflux uropathy: Secondary | ICD-10-CM

## 2016-06-30 DIAGNOSIS — G6289 Other specified polyneuropathies: Secondary | ICD-10-CM | POA: Diagnosis not present

## 2016-06-30 DIAGNOSIS — I480 Paroxysmal atrial fibrillation: Secondary | ICD-10-CM

## 2016-06-30 DIAGNOSIS — E785 Hyperlipidemia, unspecified: Secondary | ICD-10-CM | POA: Diagnosis not present

## 2016-06-30 DIAGNOSIS — R739 Hyperglycemia, unspecified: Secondary | ICD-10-CM | POA: Diagnosis not present

## 2016-06-30 DIAGNOSIS — Z7189 Other specified counseling: Secondary | ICD-10-CM

## 2016-06-30 DIAGNOSIS — Z0000 Encounter for general adult medical examination without abnormal findings: Secondary | ICD-10-CM | POA: Diagnosis not present

## 2016-06-30 DIAGNOSIS — M545 Low back pain: Secondary | ICD-10-CM

## 2016-06-30 DIAGNOSIS — I10 Essential (primary) hypertension: Secondary | ICD-10-CM

## 2016-06-30 DIAGNOSIS — G8929 Other chronic pain: Secondary | ICD-10-CM

## 2016-06-30 DIAGNOSIS — G4700 Insomnia, unspecified: Secondary | ICD-10-CM

## 2016-06-30 DIAGNOSIS — I5022 Chronic systolic (congestive) heart failure: Secondary | ICD-10-CM

## 2016-06-30 LAB — COMPREHENSIVE METABOLIC PANEL
ALBUMIN: 3.9 g/dL (ref 3.5–5.2)
ALK PHOS: 90 U/L (ref 39–117)
ALT: 12 U/L (ref 0–53)
AST: 17 U/L (ref 0–37)
BILIRUBIN TOTAL: 1.3 mg/dL — AB (ref 0.2–1.2)
BUN: 20 mg/dL (ref 6–23)
CO2: 27 mEq/L (ref 19–32)
CREATININE: 1.57 mg/dL — AB (ref 0.40–1.50)
Calcium: 9.2 mg/dL (ref 8.4–10.5)
Chloride: 104 mEq/L (ref 96–112)
GFR: 47.11 mL/min — ABNORMAL LOW (ref >60.00)
GLUCOSE: 103 mg/dL — AB (ref 70–99)
POTASSIUM: 4.7 meq/L (ref 3.5–5.1)
SODIUM: 141 meq/L (ref 135–145)
TOTAL PROTEIN: 6.1 g/dL (ref 6.0–8.3)

## 2016-06-30 LAB — LDL CHOLESTEROL, DIRECT: Direct LDL: 56 mg/dL

## 2016-06-30 LAB — CBC WITH DIFFERENTIAL/PLATELET
BASOS ABS: 0 10*3/uL (ref 0.0–0.1)
BASOS PCT: 0.3 % (ref 0.0–3.0)
EOS ABS: 0.3 10*3/uL (ref 0.0–0.7)
Eosinophils Relative: 3.9 % (ref 0.0–5.0)
HEMATOCRIT: 41.7 % (ref 39.0–52.0)
Hemoglobin: 13.2 g/dL (ref 13.0–17.0)
LYMPHS ABS: 1.8 10*3/uL (ref 0.7–4.0)
Lymphocytes Relative: 23.3 % (ref 12.0–46.0)
MCHC: 31.6 g/dL (ref 30.0–36.0)
MCV: 73.3 fl — ABNORMAL LOW (ref 78.0–100.0)
Monocytes Absolute: 0.5 10*3/uL (ref 0.1–1.0)
Monocytes Relative: 6.2 % (ref 3.0–12.0)
NEUTROS ABS: 5.1 10*3/uL (ref 1.4–7.7)
NEUTROS PCT: 66.3 % (ref 43.0–77.0)
PLATELETS: 180 10*3/uL (ref 150.0–400.0)
RBC: 5.68 Mil/uL (ref 4.22–5.81)
RDW: 20.6 % — AB (ref 11.5–15.5)
WBC: 7.7 10*3/uL (ref 4.0–10.5)

## 2016-06-30 LAB — PSA: PSA: 0.54 ng/mL (ref 0.10–4.00)

## 2016-06-30 LAB — LIPID PANEL
Cholesterol: 128 mg/dL (ref 0–200)
HDL: 37.8 mg/dL — AB (ref >39.00)
NONHDL: 90.05
Total CHOL/HDL Ratio: 3
Triglycerides: 234 mg/dL — ABNORMAL HIGH (ref 0.0–149.0)
VLDL: 46.8 mg/dL — ABNORMAL HIGH (ref 0.0–40.0)

## 2016-06-30 LAB — VITAMIN B12: Vitamin B-12: 424 pg/mL (ref 211–911)

## 2016-06-30 LAB — TSH: TSH: 2.89 u[IU]/mL (ref 0.35–4.50)

## 2016-06-30 LAB — HEMOGLOBIN A1C: HEMOGLOBIN A1C: 6.2 % (ref 4.6–6.5)

## 2016-06-30 MED ORDER — AMITRIPTYLINE HCL 25 MG PO TABS: 25.0000 mg | ORAL_TABLET | Freq: Every day | ORAL | 6 refills | Status: DC

## 2016-06-30 MED ORDER — FINASTERIDE 5 MG PO TABS
5.0000 mg | ORAL_TABLET | Freq: Every day | ORAL | 3 refills | Status: DC
Start: 2016-06-30 — End: 2017-07-19

## 2016-06-30 MED ORDER — PANTOPRAZOLE SODIUM 40 MG PO TBEC: 40.0000 mg | DELAYED_RELEASE_TABLET | Freq: Two times a day (BID) | ORAL | 3 refills | Status: DC

## 2016-06-30 NOTE — Assessment & Plan Note (Addendum)
Continue gabapentin 300mg  bid. Trial amitriptyline 25mg  for back pain and periph neuropathy.

## 2016-06-30 NOTE — Assessment & Plan Note (Signed)
Appreciate cards care of patient. Continue current regimen. Seems euvolemic.

## 2016-06-30 NOTE — Progress Notes (Signed)
Pre visit review using our clinic review tool, if applicable. No additional management support is needed unless otherwise documented below in the visit note. 

## 2016-06-30 NOTE — Progress Notes (Signed)
BP 118/72 (BP Location: Left Arm, Patient Position: Sitting, Cuff Size: Large)   Pulse (!) 56   Temp 97.6 F (36.4 C) (Oral)   Ht 5' 7.5" (1.715 m) Comment: shoes  Wt 295 lb 12 oz (134.2 kg)   SpO2 95%   BMI 45.64 kg/m    CC: CPE Subjective:    Patient ID: Patrick Garner, male    DOB: 20-Aug-1950, 66 y.o.   MRN: [id number]  HPI: Patrick Garner is a 66 y.o. male presenting on 06/30/2016 for No chief complaint on file.   Patrick Garner earlier today for medicare wellness visit. Note will be reviewed when completed. Ongoing hand/foot pain described as ache, attributed to arthritis. On gabapentin 363m bid for paresthesias. Takes tylenol for arthritis. Ongoing trouble falling asleep due to insomnia and joint pains. Bedtime is 6-7 pm, but goes to sleep 11pm. Wakes up to void at least once nightly.   Saw Dr YDimas Aguasearly this month, stable report. Activity limited by dyspnea, leg and back pain.    [place]. 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 H/o GI bleed s/p colonoscopy. Atrial fibrillation - on sotalol 425mbid, xarelto and aspirin daily, h/o GI bleed.   Worsening paresthesias - in hands and feet, worse at night time. Gabapentin was previously stopped 2/2 bradycardia at higher doses. We restarted last visit 30067mid if tolerated.  Preventative: Colon cancer screening - positive stool kit, states he had normal EGD and 10 polyps removed from colonoscopy 12[place]2013 [place] Dr [place] Prostate cancer screening - pt does not want further prostate exams. Declines screening this year. Would be ok with PSA test.H/o BPH. Denies further sxs, proscar working well without nocturia.  Flu shot 03/2016 Pneumovax - 05/2012.Prevnar 11/2014. Pneumovax will be due 05/2017 Tetanus 2008.  Shingles shot - declines Advanced directive scanned 12/2014. Wife Patrick Garner HCPOA. No prolonged life support if terminal  condition. No tube feeds.  Seat Garner use discussed - doesn't wear seat Garner Sunscreen use discussed. No changing moles on skin. Ex smoker - quit 08/2013 Alcohol - none  Caffeine: rare  Lives with wife, 1 cat  [place] since mid 2002, was truck driver  Activity: no regular exercise  Diet: some water, fruits/vegetables daily.   Relevant past medical, surgical, family and social history reviewed and updated as indicated. Interim medical history since our last visit reviewed. Allergies and medications reviewed and updated. Current Outpatient Prescriptions on File Prior to Visit  Medication Sig  . aspirin 81 MG tablet Take 81 mg by mouth daily.  . aMarland Kitchenorvastatin (LIPITOR) 80 MG tablet Take 80 mg by mouth daily.  . carvedilol (COREG) 12.5 MG tablet Take 12.5 mg by mouth 2 (two) times daily with a meal.  . furosemide (LASIX) 40 MG tablet Take 40 mg by mouth 2 (two) times daily.  . gMarland Kitchenbapentin (NEURONTIN) 300 MG capsule TAKE ONE CAPSULE BY MOUTH TWICE A DAY  . lisinopril (PRINIVIL,ZESTRIL) 20 MG tablet Take 20 mg by mouth daily.  . Multiple Vitamin (MULTIVITAMIN) tablet Take 1 tablet by mouth daily.  . OMarland KitchenANZapine (ZYPREXA) 5 MG tablet Take 1 tablet (5 mg total) by mouth at bedtime.  . potassium chloride (K-DUR,KLOR-CON) 10 MEQ tablet Take 10 mEq by mouth 2 (two) times daily.  . rivaroxaban (  XARELTO) 20 MG TABS tablet Take 20 mg by mouth daily with supper.  . sotalol (BETAPACE) 80 MG tablet TAKE ONE-HALF TABLETS BY MOUTH TWO TIMES A DAY  . spironolactone (ALDACTONE) 25 MG tablet Take 25 mg by mouth daily.   No current facility-administered medications on file prior to visit.     Review of Systems  Constitutional: Negative for activity change, appetite change, chills, fatigue, fever and unexpected weight change.  HENT: Negative for hearing loss.   Eyes: Negative for visual  disturbance.  Respiratory: Negative for cough, chest tightness, shortness of breath and wheezing.   Cardiovascular: Negative for chest pain, palpitations and leg swelling.  Gastrointestinal: Negative for abdominal distention, abdominal pain, blood in stool, constipation, diarrhea, nausea and vomiting.  Genitourinary: Negative for difficulty urinating and hematuria.  Musculoskeletal: Positive for arthralgias. Negative for myalgias and neck pain.  Skin: Negative for rash.  Neurological: Positive for headaches. Negative for dizziness, seizures and syncope.  Hematological: Negative for adenopathy. Does not bruise/bleed easily.  Psychiatric/Behavioral: Negative for dysphoric mood. The patient is not nervous/anxious.        Doing well on zyprexa   Per HPI unless specifically indicated in ROS section     Objective:    BP 118/72 (BP Location: Left Arm, Patient Position: Sitting, Cuff Size: Large)   Pulse (!) 56   Temp 97.6 F (36.4 C) (Oral)   Ht 5' 7.5" (1.715 m) Comment: shoes  Wt 295 lb 12 oz (134.2 kg)   SpO2 95%   BMI 45.64 kg/m   Wt Readings from Last 3 Encounters:  06/30/16 295 lb 12 oz (134.2 kg)  06/30/16 295 lb 12 oz (134.2 kg)  12/27/15 298 lb 3.2 oz (135.3 kg)    Physical Exam  Constitutional: He is oriented to person, place, and time. He appears well-developed and well-nourished. No distress.  HENT:  Head: Normocephalic and atraumatic.  Right Ear: Hearing, tympanic membrane, external ear and ear canal normal.  Left Ear: Hearing, tympanic membrane, external ear and ear canal normal.  Nose: Nose normal.  Mouth/Throat: Uvula is midline, oropharynx is clear and moist and mucous membranes are normal. No oropharyngeal exudate, posterior oropharyngeal edema or posterior oropharyngeal erythema.  Eyes: Conjunctivae and EOM are normal. Pupils are equal, round, and reactive to light. No scleral icterus.  Neck: Normal range of motion. Neck supple. Carotid bruit is not present. No  thyromegaly present.  Cardiovascular: Normal rate, regular rhythm, normal heart sounds and intact distal pulses.   No murmur heard. Pulses:      Radial pulses are 2+ on the right side, and 2+ on the left side.  Pulmonary/Chest: Effort normal and breath sounds normal. No respiratory distress. He has no wheezes. He has no rales.  Abdominal: Soft. Bowel sounds are normal. He exhibits no distension and no mass. There is no tenderness. There is no rebound and no guarding.  Musculoskeletal: Normal range of motion. He exhibits no edema.  Lymphadenopathy:    He has no cervical adenopathy.  Neurological: He is alert and oriented to person, place, and time.  CN grossly intact, station and gait intact  Skin: Skin is warm and dry. No rash noted.  Psychiatric: He has a normal mood and affect. His behavior is normal. Judgment and thought content normal.  Nursing note and vitals reviewed.  Results for orders placed or performed in visit on 06/19/15  Comprehensive metabolic panel  Result Value Ref Range   Sodium 145 135 - 145 mEq/L   Potassium 4.3  3.5 - 5.1 mEq/L   Chloride 105 96 - 112 mEq/L   CO2 31 19 - 32 mEq/L   Glucose, Bld 146 (H) 70 - 99 mg/dL   BUN 15 6 - 23 mg/dL   Creatinine, Ser 1.42 0.40 - 1.50 mg/dL   Total Bilirubin 1.1 0.2 - 1.2 mg/dL   Alkaline Phosphatase 112 39 - 117 U/L   AST 15 0 - 37 U/L   ALT 12 0 - 53 U/L   Total Protein 6.3 6.0 - 8.3 g/dL   Albumin 3.9 3.5 - 5.2 g/dL   Calcium 9.3 8.4 - 10.5 mg/dL   GFR 53.06 (L) >60.00 mL/min  Brain natriuretic peptide  Result Value Ref Range   Pro B Natriuretic peptide (BNP) 48.0 0.0 - 100.0 pg/mL  No results found for: HGBA1C     Assessment & Plan:   Problem List Items Addressed This Visit    Advanced care planning/counseling discussion    Advanced directive scanned 12/2014. Wife Patrick Garner is HCPOA. No prolonged life support if terminal condition. No tube feeds.       Benign prostatic hyperplasia with urinary obstruction      Check PSA. Declines DRE.       Relevant Medications   finasteride (PROSCAR) 5 MG tablet   Chronic back pain    Trial amitriptyline 68m for back pain.      Health maintenance examination - Primary    Preventative protocols reviewed and updated unless pt declined. Discussed healthy diet and lifestyle.       HFrEF (heart failure with reduced ejection fraction) [place]    Appreciate cards care of patient. Continue current regimen. Seems euvolemic.      HLD (hyperlipidemia)    Check FLP today.       HTN (hypertension)    Chronic, stable. Continue current regimen.       Insomnia    Continue zyprexa nightly, but will trial amitriptyline 259mfor insomnia in hopes of tapering off antipsychotic      Paroxysmal atrial fibrillation (HCC)    Stable period. Continue aspirin, xarelto, sotalol.      Relevant Orders   CBC with Differential/Platelet   Peripheral neuropathy (HCC)    Continue gabapentin 30010mid. Trial amitriptyline 71m55mr back pain and periph neuropathy.      Relevant Medications   amitriptyline (ELAVIL) 25 MG tablet   Other Relevant Orders   Vitamin B12   TSH   Severe obesity (BMI >= 40) (HCC)    Discussed healthy diet changes to affect sustainable weight loss. Discussed decreasing sweetened beverages. Activity limited by dyspnea and pain.           Follow up plan: Return in about 6 months (around 12/28/2016) for follow up visit.  JaviRia Bush

## 2016-06-30 NOTE — Assessment & Plan Note (Signed)
Stable period. Continue aspirin, xarelto, sotalol.

## 2016-06-30 NOTE — Assessment & Plan Note (Signed)
Advanced directive scanned 12/2014. Wife Patrick Garner is HCPOA. No prolonged life support if terminal condition. No tube feeds.

## 2016-06-30 NOTE — Patient Instructions (Signed)
Patrick Garner , Thank you for taking time to come for your Medicare Wellness Visit. I appreciate your ongoing commitment to your health goals. Please review the following plan we discussed and let me know if I can assist you in the future.   These are the goals we discussed: Goals    . patient safety          Starting 06/30/16, I will continue to use my cane as needed to reduce risk of falls.        This is a list of the screening recommended for you and due dates:  Health Maintenance  Topic Date Due  . DTaP/Tdap/Td vaccine (1 - Tdap) 07/16/2016*  . Shingles Vaccine  06/30/2025*  . Tetanus Vaccine  07/16/2016  . Pneumonia vaccines (2 of 2 - PPSV23) 05/24/2017  . Colon Cancer Screening  01/11/2024  . Flu Shot  Addressed  .  Hepatitis C: One time screening is recommended by Center for Disease Control  (CDC) for  adults born from [birth year] through 1966.   Completed  *Topic was postponed. The date shown is not the original due date.   Preventive Care for Adults  A healthy lifestyle and preventive care can promote health and wellness. Preventive health guidelines for adults include the following key practices.  . A routine yearly physical is a good way to check with your health care provider about your health and preventive screening. It is a chance to share any concerns and updates on your health and to receive a thorough exam.  . Visit your dentist for a routine exam and preventive care every 6 months. Brush your teeth twice a day and floss once a day. Good oral hygiene prevents tooth decay and gum disease.  . The frequency of eye exams is based on your age, health, family medical history, use  of contact lenses, and other factors. Follow your health care provider's ecommendations for frequency of eye exams.  . Eat a healthy diet. Foods like vegetables, fruits, whole grains, low-fat dairy products, and lean protein foods contain the nutrients you need without too many calories. Decrease your  intake of foods high in solid fats, added sugars, and salt. Eat the right amount of calories for you. Get information about a proper diet from your health care provider, if necessary.  . Regular physical exercise is one of the most important things you can do for your health. Most adults should get at least 150 minutes of moderate-intensity exercise (any activity that increases your heart rate and causes you to sweat) each week. In addition, most adults need muscle-strengthening exercises on 2 or more days a week.  Silver Sneakers may be a benefit available to you. To determine eligibility, you may visit the website: www.silversneakers.com or contact program at 650-067-1791 Mon-Fri between 8AM-8PM.   . Maintain a healthy weight. The body mass index (BMI) is a screening tool to identify possible weight problems. It provides an estimate of body fat based on height and weight. Your health care provider can find your BMI and can help you achieve or maintain a healthy weight.   For adults 20 years and older: ? A BMI below 18.5 is considered underweight. ? A BMI of 18.5 to 24.9 is normal. ? A BMI of 25 to 29.9 is considered overweight. ? A BMI of 30 and above is considered obese.   . Maintain normal blood lipids and cholesterol levels by exercising and minimizing your intake of saturated fat. Eat a balanced diet  with plenty of fruit and vegetables. Blood tests for lipids and cholesterol should begin at age 20 and be repeated every 5 years. If your lipid or cholesterol levels are high, you are over 50, or you are at high risk for heart disease, you may need your cholesterol levels checked more frequently. Ongoing high lipid and cholesterol levels should be treated with medicines if diet and exercise are not working.  . If you smoke, find out from your health care provider how to quit. If you do not use tobacco, please do not start.  . If you choose to drink alcohol, please do not consume more than 2  drinks per day. One drink is considered to be 12 ounces (355 mL) of beer, 5 ounces (148 mL) of wine, or 1.5 ounces (44 mL) of liquor.  . If you are 43-43 years old, ask your health care provider if you should take aspirin to prevent strokes.  . Use sunscreen. Apply sunscreen liberally and repeatedly throughout the day. You should seek shade when your shadow is shorter than you. Protect yourself by wearing long sleeves, pants, a wide-brimmed hat, and sunglasses year round, whenever you are outdoors.  . Once a month, do a whole body skin exam, using a mirror to look at the skin on your back. Tell your health care provider of new moles, moles that have irregular borders, moles that are larger than a pencil eraser, or moles that have changed in shape or color.

## 2016-06-30 NOTE — Assessment & Plan Note (Signed)
Chronic, stable. Continue current regimen. 

## 2016-06-30 NOTE — Assessment & Plan Note (Signed)
Check FLP today. 

## 2016-06-30 NOTE — Assessment & Plan Note (Signed)
Continue zyprexa nightly, but will trial amitriptyline 25mg  for insomnia in hopes of tapering off antipsychotic

## 2016-06-30 NOTE — Progress Notes (Signed)
Subjective:   Patrick Garner is a 66 y.o. male who presents for Medicare Annual/Subsequent preventive examination.  Review of Systems:  N/A Cardiac Risk Factors include: advanced age (>79men, >75 women);sedentary lifestyle;male gender;obesity (BMI >30kg/m2);hypertension;dyslipidemia     Objective:    Vitals: BP 118/72 (BP Location: Left Arm, Patient Position: Sitting, Cuff Size: Large)   Pulse (!) 56   Temp 97.6 F (36.4 C) (Oral)   Ht 5' 7.5" (1.715 m) Comment: shoes  Wt 295 lb 12 oz (134.2 kg)   SpO2 95%   BMI 45.64 kg/m   Body mass index is 45.64 kg/m.  Tobacco History  Smoking Status  . Former Smoker  . Packs/day: 0.80  . Types: Cigarettes  Smokeless Tobacco  . Never Used     Counseling given: No   Past Medical History:  Diagnosis Date  . Acute blood loss anemia   . AICD (automatic cardioverter/defibrillator) present   . Allergic rhinitis   . BPH (benign prostatic hypertrophy) with urinary obstruction   . 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 CAD (coronary artery disease) 2002, 2015   nonocclusive by cath 2015 - remote MI, inferior wall MI, rec medical management  . Chronic back pain    h/o spinal fusion with rods/screw, on morphine/gabapentin  . Chronic narcotic use   . GI bleed 12/2013   hospitalization s/p unrevealing EGD/colonoscopy  . History of Bell's palsy   . HLD (hyperlipidemia)   . HTN (hypertension)   . Insomnia   . OSA (obstructive sleep apnea)    refuses sleep eval  . 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 Paroxysmal atrial fibrillation ([place]) 10/2013   new onset  . Peripheral neuropathy ([place])   . PUD (peptic ulcer disease) 1980s   via EGD  . 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 RBBB 2002  . RLS (restless legs syndrome)   . Smoker   . Systolic CHF ([place]) 123456   EF 27% on 2d lexisccan cardiolite stress test [facility])   Past Surgical History:  Procedure Laterality Date  .  BACK SURGERY  2002  . CARDIAC CATHETERIZATION  10/2013   diffuse CAD, no sites for PCI, rec against CABG  . CARDIOVASCULAR STRESS TEST  2002   Adenosine Cardiolite with prior small inferior infarct, EF 59%  . CARDIOVASCULAR STRESS TEST  10/2013   lexiscan cardiolite - mod sized nontransmural infarct in inferior wall with small mild intensity peri-infarct ischemia, likely pulm HTN, akinesis of inferior wall with marked hypokinesis of LV and EF 27%  . HERNIA REPAIR  2005  . KNEE SURGERY  2008  . PACEMAKER INSERTION  01/04/2014   AICD dualchamber PPM/ICD [employer] [id number], serial [id number]  . TONSILLECTOMY  1950's  . US ECHOCARDIOGRAPHY  12/2013   at [place] - EF 25-35% with mod global hypokinesis   Family History  Problem Relation Age of Onset  . Heart disease Father     CHF  . Cancer Father     unsure type  . Diabetes Paternal Aunt   . Diabetes Paternal Uncle   . Stroke Neg Hx   . CAD Neg Hx    History  Sexual Activity  . Sexual activity: No    Outpatient Encounter Prescriptions as of 06/30/2016  Medication Sig  . aspirin 81 MG tablet Take 81 mg by mouth daily.  Marland Kitchen atorvastatin (LIPITOR) 80 MG tablet Take 80 mg  by mouth daily.  . carvedilol (COREG) 12.5 MG tablet Take 12.5 mg by mouth 2 (two) times daily with a meal.  . finasteride (PROSCAR) 5 MG tablet TAKE ONE TABLET BY MOUTH DAILY  . furosemide (LASIX) 40 MG tablet Take 40 mg by mouth 2 (two) times daily.  Marland Kitchen gabapentin (NEURONTIN) 300 MG capsule TAKE ONE CAPSULE BY MOUTH TWICE A DAY  . lisinopril (PRINIVIL,ZESTRIL) 20 MG tablet Take 20 mg by mouth daily.  . Multiple Vitamin (MULTIVITAMIN) tablet Take 1 tablet by mouth daily.  Marland Kitchen OLANZapine (ZYPREXA) 5 MG tablet Take 1 tablet (5 mg total) by mouth at bedtime.  . pantoprazole (PROTONIX) 40 MG tablet TAKE ONE TABLET BY MOUTH TWICE A DAY  . potassium chloride (K-DUR,KLOR-CON) 10 MEQ tablet Take 10 mEq by mouth 2 (two) times daily.  . rivaroxaban (XARELTO) 20 MG TABS tablet Take 20 mg by  mouth daily with supper.  . sotalol (BETAPACE) 80 MG tablet TAKE ONE-HALF TABLETS BY MOUTH TWO TIMES A DAY  . spironolactone (ALDACTONE) 25 MG tablet Take 25 mg by mouth daily.   No facility-administered encounter medications on file as of 06/30/2016.     Activities of Daily Living In your present state of health, do you have any difficulty performing the following activities: 06/30/2016  Hearing? N  Vision? N  Difficulty concentrating or making decisions? N  Walking or climbing stairs? Y  Dressing or bathing? Y  Doing errands, shopping? N  Preparing Food and eating ? N  Using the Toilet? N  In the past six months, have you accidently leaked urine? N  Do you have problems with loss of bowel control? N  Managing your Medications? N  Managing your Finances? N  Housekeeping or managing your Housekeeping? Y  Some recent data might be hidden    Patient Care Team: Ria Bush, MD as PCP - General (Family Medicine)   Assessment:     Hearing Screening   125Hz  250Hz  500Hz  1000Hz  2000Hz  3000Hz  4000Hz  6000Hz  8000Hz   Right ear:   40 40 40  40    Left ear:   40 40 40  40    Vision Screening Comments: Last vision exam in August 2017 with Dr. Altamease Oiler   Exercise Activities and Dietary recommendations Current Exercise Habits: The patient does not participate in regular exercise at present, Exercise limited by: orthopedic condition(s)  Goals    . patient safety          Starting 06/30/16, I will continue to use my cane as needed to reduce risk of falls.       Fall Risk Fall Risk  06/30/2016 11/14/2014 08/29/2013 05/24/2012  Falls in the past year? No Yes No Yes  Number falls in past yr: - 2 or more - 1  Injury with Fall? - No - No  Risk Factor Category  - High Fall Risk - -  Risk for fall due to : - Impaired balance/gait;Impaired mobility - -  Risk for fall due to (comments): - Uses cane - -   Depression Screen PHQ 2/9 Scores 06/30/2016 11/14/2014 08/29/2013 05/24/2012  PHQ - 2 Score 4  2 3  0  PHQ- 9 Score 10 9 4  -    Cognitive Function MMSE - Mini Mental State Exam 06/30/2016  Orientation to time 5  Orientation to Place 5  Registration 3  Attention/ Calculation 0  Recall 3  Language- name 2 objects 0  Language- repeat 1  Language- follow 3 step command 3  Language-  read & follow direction 0  Write a sentence 0  Copy design 0  Total score 20       PLEASE NOTE: A Mini-Cog screen was completed. Maximum score is 20. A value of 0 denotes this part of [place]stein MMSE was not completed or the patient failed this part of the Mini-Cog screening.   Mini-Cog Screening Orientation to Time - Max 5 pts Orientation to Place - Max 5 pts Registration - Max 3 pts Recall - Max 3 pts Language Repeat - Max 1 pts Language Follow 3 Step Command - Max 3 pts   Immunization History  Administered Date(s) Administered  . Influenza Split 02/24/2012  . Influenza,inj,Quad PF,36+ Mos 02/12/2014, 06/19/2015  . Pneumococcal Conjugate-13 11/14/2014  . Pneumococcal Polysaccharide-23 05/24/2012  . Td 06/15/2006   Screening Tests Health Maintenance  Topic Date Due  . DTaP/Tdap/Td (1 - Tdap) 07/16/2016 (Originally 06/16/2006)  . ZOSTAVAX  06/30/2025 (Originally 10/05/2009)  . TETANUS/TDAP  07/16/2016  . PNA vac Low Risk Adult (2 of 2 - PPSV23) 05/24/2017  . COLONOSCOPY  01/11/2024  . INFLUENZA VACCINE  Addressed  . Hepatitis C Screening  Completed      Plan:     I have personally reviewed and addressed the Medicare Annual Wellness questionnaire and have noted the following in the patient's chart:  A. Medical and social history B. Use of alcohol, tobacco or illicit drugs  C. Current medications and supplements D. Functional ability and status E.  Nutritional status F.  Physical activity G. Advance directives H. List of other physicians I.  Hospitalizations, surgeries, and ER visits in previous 12 months J.  [place] to include hearing, vision, cognitive,  depression L. Referrals and appointments - none  In addition, I have reviewed and discussed with patient certain preventive protocols, quality metrics, and best practice recommendations. A written personalized care plan for preventive services as well as general preventive health recommendations were provided to patient.  See attached scanned questionnaire for additional information.   Signed,   Lindell Noe, MHA, BS, LPN Health Coach

## 2016-06-30 NOTE — Assessment & Plan Note (Signed)
Preventative protocols reviewed and updated unless pt declined. Discussed healthy diet and lifestyle.  

## 2016-06-30 NOTE — Assessment & Plan Note (Signed)
Discussed healthy diet changes to affect sustainable weight loss. Discussed decreasing sweetened beverages. Activity limited by dyspnea and pain.

## 2016-06-30 NOTE — Patient Instructions (Addendum)
Sign release for records of colonoscopy from Dr Deboraha Sprang (2015) Blood work today.  Trial amitriptyline 25mg  at bedtime. If tolerating well and effective, trial off olanzapine. Update me with how you're doing. Return in 6 months for follow up   Health Maintenance, Male A healthy lifestyle and preventative care can promote health and wellness.  Maintain regular health, dental, and eye exams.  Eat a healthy diet. Foods like vegetables, fruits, whole grains, low-fat dairy products, and lean protein foods contain the nutrients you need and are low in calories. Decrease your intake of foods high in solid fats, added sugars, and salt. Get information about a proper diet from your health care provider, if necessary.  Regular physical exercise is one of the most important things you can do for your health. Most adults should get at least 150 minutes of moderate-intensity exercise (any activity that increases your heart rate and causes you to sweat) each week. In addition, most adults need muscle-strengthening exercises on 2 or more days a week.   Maintain a healthy weight. The body mass index (BMI) is a screening tool to identify possible weight problems. It provides an estimate of body fat based on height and weight. Your health care provider can find your BMI and can help you achieve or maintain a healthy weight. For males 20 years and older:  A BMI below 18.5 is considered underweight.  A BMI of 18.5 to 24.9 is normal.  A BMI of 25 to 29.9 is considered overweight.  A BMI of 30 and above is considered obese.  Maintain normal blood lipids and cholesterol by exercising and minimizing your intake of saturated fat. Eat a balanced diet with plenty of fruits and vegetables. Blood tests for lipids and cholesterol should begin at age 79 and be repeated every 5 years. If your lipid or cholesterol levels are high, you are over age 65, or you are at high risk for heart disease, you may need your cholesterol  levels checked more frequently.Ongoing high lipid and cholesterol levels should be treated with medicines if diet and exercise are not working.  If you smoke, find out from your health care provider how to quit. If you do not use tobacco, do not start.  Lung cancer screening is recommended for adults aged 52-80 years who are at high risk for developing lung cancer because of a history of smoking. A yearly low-dose CT scan of the lungs is recommended for people who have at least a 30-pack-year history of smoking and are current smokers or have quit within the past 15 years. A pack year of smoking is smoking an average of 1 pack of cigarettes a day for 1 year (for example, a 30-pack-year history of smoking could mean smoking 1 pack a day for 30 years or 2 packs a day for 15 years). Yearly screening should continue until the smoker has stopped smoking for at least 15 years. Yearly screening should be stopped for people who develop a health problem that would prevent them from having lung cancer treatment.  If you choose to drink alcohol, do not have more than 2 drinks per day. One drink is considered to be 12 oz (360 mL) of beer, 5 oz (150 mL) of wine, or 1.5 oz (45 mL) of liquor.  Avoid the use of street drugs. Do not share needles with anyone. Ask for help if you need support or instructions about stopping the use of drugs.  High blood pressure causes heart disease and increases the  risk of stroke. High blood pressure is more likely to develop in:  People who have blood pressure in the end of the normal range (100-139/85-89 mm Hg).  People who are overweight or obese.  People who are African American.  If you are 16-70 years of age, have your blood pressure checked every 3-5 years. If you are 87 years of age or older, have your blood pressure checked every year. You should have your blood pressure measured twice-once when you are at a hospital or clinic, and once when you are not at a hospital or  clinic. Record the average of the two measurements. To check your blood pressure when you are not at a hospital or clinic, you can use:  An automated blood pressure machine at a pharmacy.  A home blood pressure monitor.  If you are 14-60 years old, ask your health care provider if you should take aspirin to prevent heart disease.  Diabetes screening involves taking a blood sample to check your fasting blood sugar level. This should be done once every 3 years after age 65 if you are at a normal weight and without risk factors for diabetes. Testing should be considered at a younger age or be carried out more frequently if you are overweight and have at least 1 risk factor for diabetes.  Colorectal cancer can be detected and often prevented. Most routine colorectal cancer screening begins at the age of 39 and continues through age 39. However, your health care provider may recommend screening at an earlier age if you have risk factors for colon cancer. On a yearly basis, your health care provider may provide home test kits to check for hidden blood in the stool. A small camera at the end of a tube may be used to directly examine the colon (sigmoidoscopy or colonoscopy) to detect the earliest forms of colorectal cancer. Talk to your health care provider about this at age 58 when routine screening begins. A direct exam of the colon should be repeated every 5-10 years through age 25, unless early forms of precancerous polyps or small growths are found.  People who are at an increased risk for hepatitis B should be screened for this virus. You are considered at high risk for hepatitis B if:  You were born in a country where hepatitis B occurs often. Talk with your health care provider about which countries are considered high risk.  Your parents were born in a high-risk country and you have not received a shot to protect against hepatitis B (hepatitis B vaccine).  You have HIV or AIDS.  You use needles  to inject street drugs.  You live with, or have sex with, someone who has hepatitis B.  You are a man who has sex with other men (MSM).  You get hemodialysis treatment.  You take certain medicines for conditions like cancer, organ transplantation, and autoimmune conditions.  Hepatitis C blood testing is recommended for all people born from [birth year] through 1966 and any individual with known risk factors for hepatitis C.  Healthy men should no longer receive prostate-specific antigen (PSA) blood tests as part of routine cancer screening. Talk to your health care provider about prostate cancer screening.  Testicular cancer screening is not recommended for adolescents or adult males who have no symptoms. Screening includes self-exam, a health care provider exam, and other screening tests. Consult with your health care provider about any symptoms you have or any concerns you have about testicular cancer.  Practice safe  sex. Use condoms and avoid high-risk sexual practices to reduce the spread of sexually transmitted infections (STIs).  You should be screened for STIs, including gonorrhea and chlamydia if:  You are sexually active and are younger than 24 years.  You are older than 24 years, and your health care provider tells you that you are at risk for this type of infection.  Your sexual activity has changed since you were last screened, and you are at an increased risk for chlamydia or gonorrhea. Ask your health care provider if you are at risk.  If you are at risk of being infected with HIV, it is recommended that you take a prescription medicine daily to prevent HIV infection. This is called pre-exposure prophylaxis (PrEP). You are considered at risk if:  You are a man who has sex with other men (MSM).  You are a heterosexual man who is sexually active with multiple partners.  You take drugs by injection.  You are sexually active with a partner who has HIV.  Talk with your health  care provider about whether you are at high risk of being infected with HIV. If you choose to begin PrEP, you should first be tested for HIV. You should then be tested every 3 months for as long as you are taking PrEP.  Use sunscreen. Apply sunscreen liberally and repeatedly throughout the day. You should seek shade when your shadow is shorter than you. Protect yourself by wearing long sleeves, pants, a wide-brimmed hat, and sunglasses year round whenever you are outdoors.  Tell your health care provider of new moles or changes in moles, especially if there is a change in shape or color. Also, tell your health care provider if a mole is larger than the size of a pencil eraser.  A one-time screening for abdominal aortic aneurysm (AAA) and surgical repair of large AAAs by ultrasound is recommended for men aged 43-75 years who are current or former smokers.  Stay current with your vaccines (immunizations). This information is not intended to replace advice given to you by your health care provider. Make sure you discuss any questions you have with your health care provider. Document Released: 11/28/2007 Document Revised: 06/22/2014 Document Reviewed: 03/05/2015 Elsevier Interactive Patient Education  2017 [employer].

## 2016-06-30 NOTE — Progress Notes (Signed)
PCP notes:   Health maintenance:  Flu vaccine - per pt, completed in Oct 2017 Tetanus - addressed Colonoscopy - per pt, completed in July 2015  Abnormal screenings:   Depression score: 10  Patient concerns:   Pt has concerns with pain in both hands and both feet.  Nurse concerns:  None  Next PCP appt:   06/30/16 @ 0930

## 2016-06-30 NOTE — Assessment & Plan Note (Signed)
Check PSA. Declines DRE.

## 2016-06-30 NOTE — Assessment & Plan Note (Signed)
Trial amitriptyline 25mg  for back pain.

## 2016-07-02 NOTE — Progress Notes (Signed)
I reviewed health advisor's note, was available for consultation, and agree with documentation and plan.  

## 2016-07-03 ENCOUNTER — Encounter: Payer: Self-pay | Admitting: *Deleted

## 2016-08-17 ENCOUNTER — Other Ambulatory Visit: Payer: Self-pay | Admitting: Family Medicine

## 2016-11-10 ENCOUNTER — Other Ambulatory Visit: Payer: Self-pay | Admitting: Family Medicine

## 2016-11-10 NOTE — Telephone Encounter (Signed)
Electronic refill request. Last office visit:   January 2018 Last Filled:    180 capsule 2 02/05/2016  Please advise.

## 2017-01-01 ENCOUNTER — Ambulatory Visit (INDEPENDENT_AMBULATORY_CARE_PROVIDER_SITE_OTHER): Payer: Medicare PPO | Admitting: Family Medicine

## 2017-01-01 ENCOUNTER — Encounter: Payer: Self-pay | Admitting: Family Medicine

## 2017-01-01 VITALS — BP 112/64 | HR 72 | Temp 97.7°F | Wt 287.8 lb

## 2017-01-01 DIAGNOSIS — G6289 Other specified polyneuropathies: Secondary | ICD-10-CM | POA: Diagnosis not present

## 2017-01-01 DIAGNOSIS — I480 Paroxysmal atrial fibrillation: Secondary | ICD-10-CM

## 2017-01-01 DIAGNOSIS — I5022 Chronic systolic (congestive) heart failure: Secondary | ICD-10-CM | POA: Diagnosis not present

## 2017-01-01 DIAGNOSIS — I10 Essential (primary) hypertension: Secondary | ICD-10-CM

## 2017-01-01 DIAGNOSIS — M545 Low back pain: Secondary | ICD-10-CM | POA: Diagnosis not present

## 2017-01-01 DIAGNOSIS — G8929 Other chronic pain: Secondary | ICD-10-CM | POA: Diagnosis not present

## 2017-01-01 LAB — BASIC METABOLIC PANEL
BUN: 14 mg/dL (ref 6–23)
CO2: 27 mEq/L (ref 19–32)
Calcium: 9.1 mg/dL (ref 8.4–10.5)
Chloride: 107 mEq/L (ref 96–112)
Creatinine, Ser: 1.36 mg/dL (ref 0.40–1.50)
GFR: 55.51 mL/min — AB (ref >60.00)
Glucose, Bld: 116 mg/dL — ABNORMAL HIGH (ref 70–99)
POTASSIUM: 4 meq/L (ref 3.5–5.1)
SODIUM: 143 meq/L (ref 135–145)

## 2017-01-01 MED ORDER — AMITRIPTYLINE HCL 50 MG PO TABS: 50.0000 mg | ORAL_TABLET | Freq: Every day | ORAL | 2 refills | Status: DC

## 2017-01-01 MED ORDER — AMITRIPTYLINE HCL 25 MG PO TABS: 25.0000 mg | ORAL_TABLET | Freq: Every day | ORAL | 2 refills | Status: DC

## 2017-01-01 NOTE — Assessment & Plan Note (Signed)
Stable period. Continue sotalol, aspirin, xarelto.

## 2017-01-01 NOTE — Assessment & Plan Note (Signed)
Appreciate cards care of patient. Continue current regimen - seems euvolemic today.

## 2017-01-01 NOTE — Assessment & Plan Note (Signed)
Wife feels this has improved on amitriptyline 25mg  nightly - continue.

## 2017-01-01 NOTE — Patient Instructions (Addendum)
Continue amitriptyline 25mg  nightly. Orthostatic vital signs today.  Labs today.  Return as needed or in 6 months for wellness visit/physical.

## 2017-01-01 NOTE — Assessment & Plan Note (Addendum)
Feels amitriptyline has helped. Continue gabapentin and amitriptyline 25mg  nightly.  Denies dry mouth, urinary retention, constipation.

## 2017-01-01 NOTE — Progress Notes (Signed)
BP 112/64   Pulse 72   Temp 97.7 F (36.5 C) (Oral)   Wt 287 lb 12 oz (130.5 kg)   SpO2 98%   BMI 44.40 kg/m    CC: 6 mo f/u visit Subjective:    Patient ID: Patrick Garner, male    DOB: 03/08/1951, 66 y.o.   MRN: [id number]  HPI: CALAB SACHSE is a 66 y.o. male presenting on 01/01/2017 for Follow-up   Lives in [place], drives 3 hours to appt.  Hospitalization 2015 for afib, sCHF, diffuse CAD with cardiomyopathy, pacer placed. H/o GI bleed s/p colonoscopy. He is on aspirin and xarelto and sotalol 40mg  bid.   periph neuropathy and lower back pain - last visit we started amitriptyline 25mg  nightly. Pt doesn't notice much improvement but wife does. Would like trial higher dose. He has stopped zyprexa.   7-8 lb weight loss noted.   Saw cardiologist last month. Planning to return to prior cardiologist Dr Sharlynn Oliphant in January 2019.  Not fasting today.   Increasing orthostatic dizziness noted over the past few months. No syncope. He feels he's staying well hydrated.  BP Readings from Last 3 Encounters:  01/01/17 112/64  06/30/16 118/72  06/30/16 118/72     Relevant past medical, surgical, family and social history reviewed and updated as indicated. Interim medical history since our last visit reviewed. Allergies and medications reviewed and updated. Outpatient Medications Prior to Visit  Medication Sig Dispense Refill  . aspirin 81 MG tablet Take 81 mg by mouth daily.    Marland Kitchen atorvastatin (LIPITOR) 80 MG tablet Take 80 mg by mouth daily.    . carvedilol (COREG) 12.5 MG tablet Take 12.5 mg by mouth 2 (two) times daily with a meal.    . finasteride (PROSCAR) 5 MG tablet Take 1 tablet (5 mg total) by mouth daily. 90 tablet 3  . furosemide (LASIX) 40 MG tablet Take 40 mg by mouth 2 (two) times daily.    Marland Kitchen gabapentin (NEURONTIN) 300 MG capsule TAKE ONE CAPSULE BY MOUTH TWICE A DAY 180 capsule 3  . lisinopril (PRINIVIL,ZESTRIL) 20 MG tablet Take 20 mg by mouth daily.    .  Multiple Vitamin (MULTIVITAMIN) tablet Take 1 tablet by mouth daily.    . pantoprazole (PROTONIX) 40 MG tablet Take 1 tablet (40 mg total) by mouth 2 (two) times daily. 180 tablet 3  . potassium chloride (K-DUR,KLOR-CON) 10 MEQ tablet Take 10 mEq by mouth 2 (two) times daily.    . rivaroxaban (XARELTO) 20 MG TABS tablet Take 20 mg by mouth daily with supper.    . sotalol (BETAPACE) 80 MG tablet TAKE ONE-HALF TABLET BY MOUTH TWO TIMES A DAY 90 tablet 1  . spironolactone (ALDACTONE) 25 MG tablet Take 25 mg by mouth daily.    Marland Kitchen amitriptyline (ELAVIL) 25 MG tablet Take 1 tablet (25 mg total) by mouth at bedtime. 30 tablet 6  . OLANZapine (ZYPREXA) 5 MG tablet Take 1 tablet (5 mg total) by mouth at bedtime. 30 tablet 11   No facility-administered medications prior to visit.      Per HPI unless specifically indicated in ROS section below Review of Systems     Objective:    BP 112/64   Pulse 72   Temp 97.7 F (36.5 C) (Oral)   Wt 287 lb 12 oz (130.5 kg)   SpO2 98%   BMI 44.40 kg/m   Wt Readings from Last 3 Encounters:  01/01/17 287 lb 12 oz (130.5  kg)  06/30/16 295 lb 12 oz (134.2 kg)  06/30/16 295 lb 12 oz (134.2 kg)    Physical Exam  Constitutional: He appears well-developed and well-nourished. No distress.  Walks with cane More unsteady today, needs assistance to get on exam table.  HENT:  Mouth/Throat: Oropharynx is clear and moist. No oropharyngeal exudate.  Cardiovascular: Normal rate, regular rhythm, normal heart sounds and intact distal pulses.   No murmur heard. Pulmonary/Chest: Effort normal and breath sounds normal. No respiratory distress. He has no wheezes. He has no rales.  Musculoskeletal: He exhibits no edema.  Skin: Skin is warm and dry. No rash noted.  Psychiatric: He has a normal mood and affect.  Nursing note and vitals reviewed.  Results for orders placed or performed in visit on 06/30/16  CBC with Differential/Platelet  Result Value Ref Range   WBC 7.7  4.0 - 10.5 K/uL   RBC 5.68 4.22 - 5.81 Mil/uL   Hemoglobin 13.2 13.0 - 17.0 g/dL   HCT 41.7 39.0 - 52.0 %   MCV 73.3 (L) 78.0 - 100.0 fl   MCHC 31.6 30.0 - 36.0 g/dL   RDW 20.6 (H) 11.5 - 15.5 %   Platelets 180.0 150.0 - 400.0 K/uL   Neutrophils Relative % 66.3 43.0 - 77.0 %   Lymphocytes Relative 23.3 12.0 - 46.0 %   Monocytes Relative 6.2 3.0 - 12.0 %   Eosinophils Relative 3.9 0.0 - 5.0 %   Basophils Relative 0.3 0.0 - 3.0 %   Neutro Abs 5.1 1.4 - 7.7 K/uL   Lymphs Abs 1.8 0.7 - 4.0 K/uL   Monocytes Absolute 0.5 0.1 - 1.0 K/uL   Eosinophils Absolute 0.3 0.0 - 0.7 K/uL   Basophils Absolute 0.0 0.0 - 0.1 K/uL  Vitamin B12  Result Value Ref Range   Vitamin B-12 424 211 - 911 pg/mL  TSH  Result Value Ref Range   TSH 2.89 0.35 - 4.50 uIU/mL   Lab Results  Component Value Date   HGBA1C 6.2 06/30/2016    Lab Results  Component Value Date   CHOL 128 06/30/2016   HDL 37.80 (L) 06/30/2016   LDLCALC 91 05/24/2012   LDLDIRECT 56.0 06/30/2016   TRIG 234.0 (H) 06/30/2016   CHOLHDL 3 06/30/2016       Assessment & Plan:   Problem List Items Addressed This Visit    Chronic back pain    Wife feels this has improved on amitriptyline 25mg  nightly - continue.       HFrEF (heart failure with reduced ejection fraction) ([place])    Appreciate cards care of patient. Continue current regimen - seems euvolemic today.       HTN (hypertension) - Primary    Chronic, stable. Continue current regimen.  Discussed importance of good hydration status.  Orthostatics negative today but his systolic bp is staying 109-323 range. I recommended decrease lasix/potassium to once daily, with option to increase to BID if leg swelling or dyspnea developing.       Relevant Orders   Basic metabolic panel   Paroxysmal atrial fibrillation (HCC)    Stable period. Continue sotalol, aspirin, xarelto.       Peripheral neuropathy    Feels amitriptyline has helped. Continue gabapentin and amitriptyline 25mg   nightly.  Denies dry mouth, urinary retention, constipation.      Relevant Medications   amitriptyline (ELAVIL) 25 MG tablet   Severe obesity (BMI >= 40) (HCC)    Continue to encourage weight loss through healthy diet  and lifestyle changes.           Follow up plan: Return in about 6 months (around 07/04/2017) for annual exam, prior fasting for blood work, medicare wellness visit.  Ria Bush, MD

## 2017-01-01 NOTE — Assessment & Plan Note (Signed)
Continue to encourage weight loss through healthy diet and lifestyle changes.  

## 2017-01-01 NOTE — Assessment & Plan Note (Addendum)
Chronic, stable. Continue current regimen.  Discussed importance of good hydration status.  Orthostatics negative today but his systolic bp is staying 473-958 range. I recommended decrease lasix/potassium to once daily, with option to increase to BID if leg swelling or dyspnea developing.

## 2017-02-10 ENCOUNTER — Telehealth: Payer: Self-pay | Admitting: Family Medicine

## 2017-02-10 MED ORDER — GABAPENTIN 300 MG PO CAPS: 300.0000 mg | ORAL_CAPSULE | Freq: Two times a day (BID) | ORAL | 3 refills | Status: DC

## 2017-02-10 NOTE — Telephone Encounter (Signed)
Received faxed refill request from [facility] in [place], [place]

## 2017-02-11 NOTE — Telephone Encounter (Signed)
Rx called in to requested pharmacy 

## 2017-02-11 NOTE — Telephone Encounter (Signed)
Received E prescribe error. plz phone in.

## 2017-02-16 ENCOUNTER — Other Ambulatory Visit: Payer: Self-pay | Admitting: Family Medicine

## 2017-03-19 ENCOUNTER — Encounter: Payer: Self-pay | Admitting: Family Medicine

## 2017-03-19 ENCOUNTER — Ambulatory Visit (INDEPENDENT_AMBULATORY_CARE_PROVIDER_SITE_OTHER): Payer: Medicare PPO | Admitting: Family Medicine

## 2017-03-19 VITALS — BP 124/70 | HR 64 | Temp 97.6°F | Wt 289.0 lb

## 2017-03-19 DIAGNOSIS — Z23 Encounter for immunization: Secondary | ICD-10-CM

## 2017-03-19 DIAGNOSIS — S37012D Minor contusion of left kidney, subsequent encounter: Secondary | ICD-10-CM | POA: Insufficient documentation

## 2017-03-19 DIAGNOSIS — R58 Hemorrhage, not elsewhere classified: Secondary | ICD-10-CM | POA: Insufficient documentation

## 2017-03-19 DIAGNOSIS — I272 Pulmonary hypertension, unspecified: Secondary | ICD-10-CM

## 2017-03-19 DIAGNOSIS — I480 Paroxysmal atrial fibrillation: Secondary | ICD-10-CM | POA: Diagnosis not present

## 2017-03-19 LAB — CBC WITH DIFFERENTIAL/PLATELET
BASOS ABS: 0.1 10*3/uL (ref 0.0–0.1)
Basophils Relative: 0.9 % (ref 0.0–3.0)
Eosinophils Absolute: 0.3 10*3/uL (ref 0.0–0.7)
Eosinophils Relative: 5.4 % — ABNORMAL HIGH (ref 0.0–5.0)
HCT: 42.4 % (ref 39.0–52.0)
Hemoglobin: 13.8 g/dL (ref 13.0–17.0)
LYMPHS ABS: 1.6 10*3/uL (ref 0.7–4.0)
Lymphocytes Relative: 27.4 % (ref 12.0–46.0)
MCHC: 32.5 g/dL (ref 30.0–36.0)
MCV: 90.6 fl (ref 78.0–100.0)
MONO ABS: 0.4 10*3/uL (ref 0.1–1.0)
Monocytes Relative: 7.6 % (ref 3.0–12.0)
NEUTROS PCT: 58.7 % (ref 43.0–77.0)
Neutro Abs: 3.3 10*3/uL (ref 1.4–7.7)
Platelets: 251 10*3/uL (ref 150.0–400.0)
RBC: 4.69 Mil/uL (ref 4.22–5.81)
RDW: 16.7 % — ABNORMAL HIGH (ref 11.5–15.5)
WBC: 5.7 10*3/uL (ref 4.0–10.5)

## 2017-03-19 LAB — RENAL FUNCTION PANEL
ALBUMIN: 3.5 g/dL (ref 3.5–5.2)
BUN: 10 mg/dL (ref 6–23)
CO2: 30 mEq/L (ref 19–32)
Calcium: 9.3 mg/dL (ref 8.4–10.5)
Chloride: 105 mEq/L (ref 96–112)
Creatinine, Ser: 1.07 mg/dL (ref 0.40–1.50)
GFR: 73.17 mL/min (ref >60.00)
GLUCOSE: 108 mg/dL — AB (ref 70–99)
POTASSIUM: 4.1 meq/L (ref 3.5–5.1)
Phosphorus: 3.4 mg/dL (ref 2.3–4.6)
SODIUM: 143 meq/L (ref 135–145)

## 2017-03-19 NOTE — Assessment & Plan Note (Signed)
Discharge Hgb was ~10.5. Update CBC today.

## 2017-03-19 NOTE — Assessment & Plan Note (Signed)
Seems to be recovering well. Has f/u planned with urology.

## 2017-03-19 NOTE — Assessment & Plan Note (Signed)
Sotalol was recently increased, xarelto was restarted, aspirin was discontinued by hospital. Will await cards recs on sotalol dosing and aspirin continuation. Reviewed difference between aspirin and xarelto.

## 2017-03-19 NOTE — Patient Instructions (Addendum)
Labs today. Flu shot today.  Schedule appointment this month with cardiologist Dr Heath Lark.  New medicine list updated - see last page of instructions.

## 2017-03-19 NOTE — Progress Notes (Signed)
BP 124/70 (BP Location: Left Arm, Patient Position: Sitting, Cuff Size: Large)   Pulse 64   Temp 97.6 F (36.4 C) (Oral)   Wt 289 lb (131.1 kg)   SpO2 97%   BMI 44.60 kg/m    CC: hospital f/u visit Subjective:    Patient ID: Patrick Garner, male    DOB: 1950-12-14, 66 y.o.   MRN: [id number]  HPI: Patrick Garner is a 66 y.o. male presenting on 03/19/2017 for Hospitalization Follow-up (D/c 03/06/17 from hospital in [place], [place]. Provided d/c summary. Wants to discuss meds)   Lives in [place], drives 3 hours to appt.  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 H/o GI bleed s/p colonoscopy. Discharged on aspirin, xarelto and sotalol 40mg  bid. Sees cardiologist in [facility] regularly.   Known peripheral neuropathy and chronic lower back pain. Last visit we continued amitriptyline 25mg  nightly and gabapentin 300mg  bid.   Here for hosp f/u visit after hospitalization 9/13-22/2018 at [facility], brings D/C summary which was reviewed, no labwork attached. Admitted with syncope and dizziness after fall at home onto L flank, found to have acute retroperitoneal and GI bleed with Hgb drop from 12 --> 9.2. S/p 1u pRBC transfusion. Underwent normal cardiac stress testing in hospital. xarelto was initially held, then restarted at 20mg  daily due to high CHADS-VASc score. Discharge Hgb 10.7. Aspirin was stopped.   Discharged with [facility] PT - this starts Monday. He has been feeling ill over the past week - feverish, nauseated. Denies abdominal or flank pain, cough, syncope or falls, voiding well without hematuria or incomplete emptying.   Cardiologist Dr Heath Lark. Recommended CPAP for his OSA, he declined.   Relevant past medical, surgical, family and social history reviewed and updated as indicated. Interim medical history since our last visit reviewed. Allergies and medications reviewed and updated. Outpatient Medications Prior to  Visit  Medication Sig Dispense Refill  . atorvastatin (LIPITOR) 80 MG tablet Take 80 mg by mouth daily.    . finasteride (PROSCAR) 5 MG tablet Take 1 tablet (5 mg total) by mouth daily. 90 tablet 3  . gabapentin (NEURONTIN) 300 MG capsule Take 1 capsule (300 mg total) by mouth 2 (two) times daily. 180 capsule 3  . Multiple Vitamin (MULTIVITAMIN) tablet Take 1 tablet by mouth daily.    . pantoprazole (PROTONIX) 40 MG tablet Take 1 tablet (40 mg total) by mouth 2 (two) times daily. 180 tablet 3  . rivaroxaban (XARELTO) 20 MG TABS tablet Take 20 mg by mouth daily with supper.    Marland Kitchen amitriptyline (ELAVIL) 25 MG tablet Take 1 tablet (25 mg total) by mouth at bedtime. 90 tablet 2  . carvedilol (COREG) 12.5 MG tablet Take 12.5 mg by mouth 2 (two) times daily with a meal.    . furosemide (LASIX) 40 MG tablet Take 40 mg by mouth 2 (two) times daily.    Marland Kitchen lisinopril (PRINIVIL,ZESTRIL) 20 MG tablet Take 20 mg by mouth daily.    . potassium chloride (K-DUR,KLOR-CON) 10 MEQ tablet Take 10 mEq by mouth 2 (two) times daily.    . sotalol (BETAPACE) 80 MG tablet TAKE ONE- HALF TABLET BY MOUTH TWICE A DAY 90 tablet 1  . aspirin 81 MG tablet Take 81 mg by mouth daily.    Marland Kitchen spironolactone (ALDACTONE) 25 MG tablet Take 25 mg by mouth daily.     No facility-administered medications prior to visit.      Per HPI  unless specifically indicated in ROS section below Review of Systems     Objective:    BP 124/70 (BP Location: Left Arm, Patient Position: Sitting, Cuff Size: Large)   Pulse 64   Temp 97.6 F (36.4 C) (Oral)   Wt 289 lb (131.1 kg)   SpO2 97%   BMI 44.60 kg/m   Wt Readings from Last 3 Encounters:  03/19/17 289 lb (131.1 kg)  01/01/17 287 lb 12 oz (130.5 kg)  06/30/16 295 lb 12 oz (134.2 kg)    Physical Exam  Constitutional: He appears well-developed and well-nourished. No distress.  HENT:  Mouth/Throat: No oropharyngeal exudate.  Dry mucous membranes  Cardiovascular: Normal rate, regular  rhythm, normal heart sounds and intact distal pulses.   No murmur heard. Pulmonary/Chest: Effort normal and breath sounds normal. No respiratory distress. He has no wheezes. He has no rales.  Musculoskeletal: He exhibits no edema.  Psychiatric: He has a normal mood and affect.  Nursing note and vitals reviewed.  Results for orders placed or performed in visit on 16/96/78  Basic metabolic panel  Result Value Ref Range   Sodium 143 135 - 145 mEq/L   Potassium 4.0 3.5 - 5.1 mEq/L   Chloride 107 96 - 112 mEq/L   CO2 27 19 - 32 mEq/L   Glucose, Bld 116 (H) 70 - 99 mg/dL   BUN 14 6 - 23 mg/dL   Creatinine, Ser 1.36 0.40 - 1.50 mg/dL   Calcium 9.1 8.4 - 10.5 mg/dL   GFR 55.51 (L) >60.00 mL/min      Assessment & Plan:  Med list updated.  Problem List Items Addressed This Visit    Paroxysmal atrial fibrillation ([place])    Sotalol was recently increased, xarelto was restarted, aspirin was discontinued by hospital. Will await cards recs on sotalol dosing and aspirin continuation. Reviewed difference between aspirin and xarelto.       Relevant Medications   lisinopril (PRINIVIL,ZESTRIL) 5 MG tablet   furosemide (LASIX) 40 MG tablet   sotalol (BETAPACE) 120 MG tablet   Pulmonary HTN (HCC)    Has been referred to outpatient pulm to discuss pulm HTN and OSA/CPAP which he is resistant to using.       Relevant Medications   lisinopril (PRINIVIL,ZESTRIL) 5 MG tablet   furosemide (LASIX) 40 MG tablet   sotalol (BETAPACE) 120 MG tablet   Renal hematoma, left, subsequent encounter - Primary    Seems to be recovering well. Has f/u planned with urology.       Relevant Orders   CBC with Differential/Platelet   Renal function panel   Retroperitoneal bleed    Discharge Hgb was ~10.5. Update CBC today.          Follow up plan: Return in about 3 months (around 06/19/2017) for follow up visit.  Ria Bush, MD

## 2017-03-19 NOTE — Assessment & Plan Note (Addendum)
Has been referred to outpatient pulm to discuss pulm HTN and OSA/CPAP which he is resistant to using.

## 2017-03-19 NOTE — Addendum Note (Signed)
Addended by: Brenton Grills on: 25/11/7207 19:80 PM   Modules accepted: Orders

## 2017-07-06 ENCOUNTER — Ambulatory Visit: Payer: Medicare PPO

## 2017-07-06 ENCOUNTER — Encounter: Payer: Medicare PPO | Admitting: Family Medicine

## 2017-07-19 ENCOUNTER — Other Ambulatory Visit: Payer: Self-pay | Admitting: Family Medicine

## 2017-08-08 ENCOUNTER — Other Ambulatory Visit: Payer: Self-pay | Admitting: Family Medicine

## 2017-08-08 DIAGNOSIS — N138 Other obstructive and reflux uropathy: Secondary | ICD-10-CM

## 2017-08-08 DIAGNOSIS — Z125 Encounter for screening for malignant neoplasm of prostate: Secondary | ICD-10-CM

## 2017-08-08 DIAGNOSIS — R7303 Prediabetes: Secondary | ICD-10-CM

## 2017-08-08 DIAGNOSIS — N401 Enlarged prostate with lower urinary tract symptoms: Secondary | ICD-10-CM

## 2017-08-08 DIAGNOSIS — D62 Acute posthemorrhagic anemia: Secondary | ICD-10-CM

## 2017-08-08 DIAGNOSIS — E785 Hyperlipidemia, unspecified: Secondary | ICD-10-CM

## 2017-08-10 ENCOUNTER — Ambulatory Visit (INDEPENDENT_AMBULATORY_CARE_PROVIDER_SITE_OTHER): Payer: Medicare PPO | Admitting: Family Medicine

## 2017-08-10 ENCOUNTER — Ambulatory Visit (INDEPENDENT_AMBULATORY_CARE_PROVIDER_SITE_OTHER): Payer: Medicare PPO

## 2017-08-10 ENCOUNTER — Encounter: Payer: Self-pay | Admitting: Family Medicine

## 2017-08-10 ENCOUNTER — Other Ambulatory Visit (INDEPENDENT_AMBULATORY_CARE_PROVIDER_SITE_OTHER): Payer: Medicare PPO

## 2017-08-10 VITALS — BP 118/84 | HR 77 | Temp 97.6°F | Ht 67.0 in | Wt 272.5 lb

## 2017-08-10 DIAGNOSIS — I5022 Chronic systolic (congestive) heart failure: Secondary | ICD-10-CM | POA: Diagnosis not present

## 2017-08-10 DIAGNOSIS — Z0000 Encounter for general adult medical examination without abnormal findings: Secondary | ICD-10-CM

## 2017-08-10 DIAGNOSIS — E785 Hyperlipidemia, unspecified: Secondary | ICD-10-CM

## 2017-08-10 DIAGNOSIS — Z87891 Personal history of nicotine dependence: Secondary | ICD-10-CM

## 2017-08-10 DIAGNOSIS — Z8719 Personal history of other diseases of the digestive system: Secondary | ICD-10-CM | POA: Diagnosis not present

## 2017-08-10 DIAGNOSIS — R58 Hemorrhage, not elsewhere classified: Secondary | ICD-10-CM

## 2017-08-10 DIAGNOSIS — S37012D Minor contusion of left kidney, subsequent encounter: Secondary | ICD-10-CM | POA: Diagnosis not present

## 2017-08-10 DIAGNOSIS — R7303 Prediabetes: Secondary | ICD-10-CM | POA: Diagnosis not present

## 2017-08-10 DIAGNOSIS — Z125 Encounter for screening for malignant neoplasm of prostate: Secondary | ICD-10-CM

## 2017-08-10 DIAGNOSIS — Z23 Encounter for immunization: Secondary | ICD-10-CM | POA: Diagnosis not present

## 2017-08-10 DIAGNOSIS — L989 Disorder of the skin and subcutaneous tissue, unspecified: Secondary | ICD-10-CM

## 2017-08-10 DIAGNOSIS — N401 Enlarged prostate with lower urinary tract symptoms: Secondary | ICD-10-CM

## 2017-08-10 DIAGNOSIS — K645 Perianal venous thrombosis: Secondary | ICD-10-CM

## 2017-08-10 DIAGNOSIS — C4490 Unspecified malignant neoplasm of skin, unspecified: Secondary | ICD-10-CM | POA: Insufficient documentation

## 2017-08-10 DIAGNOSIS — I480 Paroxysmal atrial fibrillation: Secondary | ICD-10-CM

## 2017-08-10 DIAGNOSIS — G4733 Obstructive sleep apnea (adult) (pediatric): Secondary | ICD-10-CM | POA: Diagnosis not present

## 2017-08-10 DIAGNOSIS — N138 Other obstructive and reflux uropathy: Secondary | ICD-10-CM

## 2017-08-10 LAB — CBC WITH DIFFERENTIAL/PLATELET
BASOS PCT: 0.6 % (ref 0.0–3.0)
Basophils Absolute: 0 10*3/uL (ref 0.0–0.1)
EOS PCT: 3.9 % (ref 0.0–5.0)
Eosinophils Absolute: 0.3 10*3/uL (ref 0.0–0.7)
HCT: 48 % (ref 39.0–52.0)
Hemoglobin: 16.4 g/dL (ref 13.0–17.0)
LYMPHS ABS: 1.9 10*3/uL (ref 0.7–4.0)
Lymphocytes Relative: 22.8 % (ref 12.0–46.0)
MCHC: 34.1 g/dL (ref 30.0–36.0)
MCV: 84.3 fl (ref 78.0–100.0)
MONOS PCT: 6.1 % (ref 3.0–12.0)
Monocytes Absolute: 0.5 10*3/uL (ref 0.1–1.0)
NEUTROS ABS: 5.5 10*3/uL (ref 1.4–7.7)
NEUTROS PCT: 66.6 % (ref 43.0–77.0)
PLATELETS: 174 10*3/uL (ref 150.0–400.0)
RBC: 5.69 Mil/uL (ref 4.22–5.81)
RDW: 16 % — ABNORMAL HIGH (ref 11.5–15.5)
WBC: 8.2 10*3/uL (ref 4.0–10.5)

## 2017-08-10 LAB — COMPREHENSIVE METABOLIC PANEL
ALBUMIN: 3.8 g/dL (ref 3.5–5.2)
ALK PHOS: 111 U/L (ref 39–117)
ALT: 12 U/L (ref 0–53)
AST: 18 U/L (ref 0–37)
BUN: 11 mg/dL (ref 6–23)
CO2: 32 mEq/L (ref 19–32)
Calcium: 9.3 mg/dL (ref 8.4–10.5)
Chloride: 103 mEq/L (ref 96–112)
Creatinine, Ser: 1.13 mg/dL (ref 0.40–1.50)
GFR: 68.62 mL/min (ref >60.00)
GLUCOSE: 100 mg/dL — AB (ref 70–99)
POTASSIUM: 3.5 meq/L (ref 3.5–5.1)
SODIUM: 144 meq/L (ref 135–145)
TOTAL PROTEIN: 6 g/dL (ref 6.0–8.3)
Total Bilirubin: 2.1 mg/dL — ABNORMAL HIGH (ref 0.2–1.2)

## 2017-08-10 LAB — LIPID PANEL
CHOLESTEROL: 131 mg/dL (ref 0–200)
HDL: 35.3 mg/dL — ABNORMAL LOW (ref >39.00)
LDL Cholesterol: 57 mg/dL (ref 0–99)
NONHDL: 95.84
Total CHOL/HDL Ratio: 4
Triglycerides: 192 mg/dL — ABNORMAL HIGH (ref 0.0–149.0)
VLDL: 38.4 mg/dL (ref 0.0–40.0)

## 2017-08-10 LAB — POC URINALSYSI DIPSTICK (AUTOMATED)
BILIRUBIN UA: NEGATIVE
Blood, UA: NEGATIVE
Glucose, UA: NEGATIVE
Ketones, UA: NEGATIVE
LEUKOCYTES UA: NEGATIVE
Nitrite, UA: NEGATIVE
PH UA: 6 (ref 5.0–8.0)
Protein, UA: NEGATIVE
Spec Grav, UA: 1.015 (ref 1.010–1.025)
Urobilinogen, UA: 0.2 E.U./dL

## 2017-08-10 LAB — PSA, MEDICARE: PSA: 0.21 ng/ml (ref 0.10–4.00)

## 2017-08-10 LAB — HEMOGLOBIN A1C: Hgb A1c MFr Bld: 5.7 % (ref 4.6–6.5)

## 2017-08-10 MED ORDER — MUPIROCIN CALCIUM 2 % EX CREA: 1.0000 "application " | TOPICAL_CREAM | Freq: Two times a day (BID) | CUTANEOUS | 0 refills | Status: DC

## 2017-08-10 MED ORDER — PANTOPRAZOLE SODIUM 40 MG PO TBEC: 40.0000 mg | DELAYED_RELEASE_TABLET | Freq: Every day | ORAL | 3 refills | Status: DC

## 2017-08-10 NOTE — Assessment & Plan Note (Signed)
Update CBC and Cr and UA today.  Now followed by urology. No records available at this time.

## 2017-08-10 NOTE — Progress Notes (Signed)
Pre visit review using our clinic review tool, if applicable. No additional management support is needed unless otherwise documented below in the visit note. 

## 2017-08-10 NOTE — Assessment & Plan Note (Addendum)
Update PSA. Declines DRE. Continue finasteride.

## 2017-08-10 NOTE — Assessment & Plan Note (Signed)
Has declined CPAP and eval.

## 2017-08-10 NOTE — Assessment & Plan Note (Addendum)
Update FLP today.   The ASCVD Risk score Mikey Bussing DC Jr., et al., 2013) failed to calculate for the following reasons:   The valid total cholesterol range is 130 to 320 mg/dL

## 2017-08-10 NOTE — Progress Notes (Signed)
PCP notes:   Health maintenance:  PPSV23 vaccine - PCP please address at next appt Tetanus vaccine - postponed/insurance  Abnormal screenings:   Fall risk - hx of single fall Fall Risk  08/10/2017 06/30/2016 11/14/2014 08/29/2013 05/24/2012  Falls in the past year? Yes No Yes No Yes  Comment fell after LOC - - - -  Number falls in past yr: 1 - 2 or more - 1  Injury with Fall? Yes - No - No  Risk Factor Category  - - High Fall Risk - -  Risk for fall due to : - - Impaired balance/gait;Impaired mobility - -  Risk for fall due to: Comment - - Uses cane - -     Patient concerns:   None  Nurse concerns:  None  Next PCP appt:   08/10/17 @ 1015

## 2017-08-10 NOTE — Assessment & Plan Note (Signed)
On sotalol, xarelto. Continue.

## 2017-08-10 NOTE — Patient Instructions (Addendum)
Sign release for colonoscopy report from Dr Christian Mate.  Pneumovax today.  If interested, check with pharmacy about new 2 shot shingles series (shingrix).  For lesion of upper R back, treat with mupirocin antibiotic cream. We will also refer you to dermatology in [place]. If it fully heals with antibiotic, may cancel appointment You are doing well today. Return as needed or in 6 months for follow up visit.  Try urinalysis today.   Health Maintenance, Male A healthy lifestyle and preventive care is important for your health and wellness. Ask your health care provider about what schedule of regular examinations is right for you. What should I know about weight and diet? Eat a Healthy Diet  Eat plenty of vegetables, fruits, whole grains, low-fat dairy products, and lean protein.  Do not eat a lot of foods high in solid fats, added sugars, or salt.  Maintain a Healthy Weight Regular exercise can help you achieve or maintain a healthy weight. You should:  Do at least 150 minutes of exercise each week. The exercise should increase your heart rate and make you sweat (moderate-intensity exercise).  Do strength-training exercises at least twice a week.  Watch Your Levels of Cholesterol and Blood Lipids  Have your blood tested for lipids and cholesterol every 5 years starting at 68 years of age. If you are at high risk for heart disease, you should start having your blood tested when you are 69 years old. You may need to have your cholesterol levels checked more often if: ? Your lipid or cholesterol levels are high. ? You are older than 68 years of age. ? You are at high risk for heart disease.  What should I know about cancer screening? Many types of cancers can be detected early and may often be prevented. Lung Cancer  You should be screened every year for lung cancer if: ? You are a current smoker who has smoked for at least 30 years. ? You are a former smoker who has quit within the past  15 years.  Talk to your health care provider about your screening options, when you should start screening, and how often you should be screened.  Colorectal Cancer  Routine colorectal cancer screening usually begins at 68 years of age and should be repeated every 5-10 years until you are 68 years old. You may need to be screened more often if early forms of precancerous polyps or small growths are found. Your health care provider may recommend screening at an earlier age if you have risk factors for colon cancer.  Your health care provider may recommend using home test kits to check for hidden blood in the stool.  A small camera at the end of a tube can be used to examine your colon (sigmoidoscopy or colonoscopy). This checks for the earliest forms of colorectal cancer.  Prostate and Testicular Cancer  Depending on your age and overall health, your health care provider may do certain tests to screen for prostate and testicular cancer.  Talk to your health care provider about any symptoms or concerns you have about testicular or prostate cancer.  Skin Cancer  Check your skin from head to toe regularly.  Tell your health care provider about any new moles or changes in moles, especially if: ? There is a change in a mole's size, shape, or color. ? You have a mole that is larger than a pencil eraser.  Always use sunscreen. Apply sunscreen liberally and repeat throughout the day.  Protect  yourself by wearing long sleeves, pants, a wide-brimmed hat, and sunglasses when outside.  What should I know about heart disease, diabetes, and high blood pressure?  If you are 84-8 years of age, have your blood pressure checked every 3-5 years. If you are 20 years of age or older, have your blood pressure checked every year. You should have your blood pressure measured twice-once when you are at a hospital or clinic, and once when you are not at a hospital or clinic. Record the average of the two  measurements. To check your blood pressure when you are not at a hospital or clinic, you can use: ? An automated blood pressure machine at a pharmacy. ? A home blood pressure monitor.  Talk to your health care provider about your target blood pressure.  If you are between 63-52 years old, ask your health care provider if you should take aspirin to prevent heart disease.  Have regular diabetes screenings by checking your fasting blood sugar level. ? If you are at a normal weight and have a low risk for diabetes, have this test once every three years after the age of 70. ? If you are overweight and have a high risk for diabetes, consider being tested at a younger age or more often.  A one-time screening for abdominal aortic aneurysm (AAA) by ultrasound is recommended for men aged 59-75 years who are current or former smokers. What should I know about preventing infection? Hepatitis B If you have a higher risk for hepatitis B, you should be screened for this virus. Talk with your health care provider to find out if you are at risk for hepatitis B infection. Hepatitis C Blood testing is recommended for:  Everyone born from [birth year] through 1966.  Anyone with known risk factors for hepatitis C.  Sexually Transmitted Diseases (STDs)  You should be screened each year for STDs including gonorrhea and chlamydia if: ? You are sexually active and are younger than 68 years of age. ? You are older than 68 years of age and your health care provider tells you that you are at risk for this type of infection. ? Your sexual activity has changed since you were last screened and you are at an increased risk for chlamydia or gonorrhea. Ask your health care provider if you are at risk.  Talk with your health care provider about whether you are at high risk of being infected with HIV. Your health care provider may recommend a prescription medicine to help prevent HIV infection.  What else can I do?  Schedule  regular health, dental, and eye exams.  Stay current with your vaccines (immunizations).  Do not use any tobacco products, such as cigarettes, chewing tobacco, and e-cigarettes. If you need help quitting, ask your health care provider.  Limit alcohol intake to no more than 2 drinks per day. One drink equals 12 ounces of beer, 5 ounces of wine, or 1 ounces of hard liquor.  Do not use street drugs.  Do not share needles.  Ask your health care provider for help if you need support or information about quitting drugs.  Tell your health care provider if you often feel depressed.  Tell your health care provider if you have ever been abused or do not feel safe at home. This information is not intended to replace advice given to you by your health care provider. Make sure you discuss any questions you have with your health care provider. Document Released: 11/28/2007 Document Revised:  01/29/2016 Document Reviewed: 03/05/2015 Elsevier Interactive Patient Education  [employer].

## 2017-08-10 NOTE — Progress Notes (Signed)
BP 118/84 (BP Location: Right Arm, Patient Position: Sitting, Cuff Size: Normal)   Pulse 77   Temp 97.6 F (36.4 C) (Oral)   Ht _0  (1.702 m)   Wt 272 lb 8 oz (123.6 kg)   SpO2 93%   BMI 42.68 kg/m    CC: CPE Subjective:    Patient ID: Patrick Garner, male    DOB: 1950-11-16, 67 y.o.   MRN: [id number]  HPI: Patrick Garner is a 67 y.o. male presenting on 08/10/2017 for Annual Exam (Pt 2.)   Lives in [place], drives 3 hours to appointment. Saw Katha Cabal today for medicare wellness visit. Note will be reviewed.    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 H/o GI bleed s/p colonoscopy. Discharged on aspirin, xarelto and sotalol 59m bid. [facility]. Hospitalization 02/2017 for L retroperitoneal bleed and GIB (Hgb nadir 9.2) after fall from syncope, as well as GI bleed.   Compliant with PPI BID since then.   Had repeat CT scan 03/2017 showing persistent hematoma and concern for ongoing bleed so saw urology - monitoring for now. I don't have records of this. Denies dyspnea, chest pain, lightheadedness.   Poorly healing wound R upper back over the past year, treating with zinc oxide or cortisone cream.  Requests labs sent to cardiology attn CKarie Georges(ph [phone] [place]   Preventative: Colon cancer screening - positive stool kit, states he had normal EGD and 10 polyps removed from colonoscopy 12/2013 [place]  Prostate cancer screening - pt does not want further prostate exams. Declines screening this year. Would be ok with PSA test.H/o BPH. Denies further sxs, proscar working well without nocturia.  Lung cancer screening - will review need at f/u visit Flu shot 03/2016  Pneumovax - 05/2012.Prevnar 11/2014. Final pneumovax today Tetanus 2008.  shingrix - discussed Advanced directive scanned 12/2014. Wife MMasahiro Iglesiais HCPOA. No prolonged life support if terminal condition. No tube feeds.  Seat belt use discussed - doesn't wear seat belt Sunscreen use discussed. No changing moles on skin. Ex smoker - quit 08/2013, prior <1 ppd Alcohol - none  Sees eye doctor Q6 months. H/o shots in eyes.   Caffeine: rare  Lives with wife, 1 cat  [place]ation[place] for chronic back pain since mid 2002, was truck driver  Activity: no regular exercise  Diet: some water, fruits/vegetables daily.   Relevant past medical, surgical, family and social history reviewed and updated as indicated. Interim medical history since our last visit reviewed. Allergies and medications reviewed and updated. Outpatient Medications Prior to Visit  Medication Sig Dispense Refill  . amitriptyline (ELAVIL) 25 MG tablet Take 1 tablet (25 mg total) by mouth at bedtime.    .Marland Kitchenatorvastatin (LIPITOR) 80 MG tablet Take 80 mg by mouth daily.    . finasteride (PROSCAR) 5 MG tablet TAKE ONE TABLET BY MOUTH DAILY 90 tablet 0  . furosemide (LASIX) 40 MG tablet Take 1 tablet (40 mg total) by mouth daily.    .Marland Kitchengabapentin (NEURONTIN) 300 MG capsule Take 1 capsule (300 mg total) by mouth 2 (two) times daily. 180 capsule 3  . lisinopril (PRINIVIL,ZESTRIL) 10 MG tablet Take 1 tablet (10 mg total) by mouth daily.    . Multiple Vitamin (MULTIVITAMIN) tablet  Take 1 tablet by mouth daily.    . potassium chloride (K-DUR,KLOR-CON) 10 MEQ tablet Take 1 tablet (10 mEq total) by mouth daily.    . rivaroxaban (XARELTO) 20 MG TABS tablet Take 20 mg by mouth daily with supper.    . sotalol (BETAPACE) 120 MG tablet Take 1 tablet (120 mg total) by mouth 2 (two) times daily.    Marland Kitchen lisinopril (PRINIVIL,ZESTRIL) 5 MG tablet Take 10 mg by mouth daily.     . pantoprazole (PROTONIX) 40 MG tablet Take 1 tablet (40 mg total) by mouth 2 (two) times daily. 180 tablet 3   No  facility-administered medications prior to visit.      Per HPI unless specifically indicated in ROS section below Review of Systems  Constitutional: Negative for activity change, appetite change, chills, fatigue, fever and unexpected weight change.  HENT: Negative for hearing loss.   Eyes: Negative for visual disturbance.  Respiratory: Negative for cough, chest tightness, shortness of breath and wheezing.   Cardiovascular: Negative for chest pain, palpitations and leg swelling.  Gastrointestinal: Positive for abdominal pain (R sided). Negative for abdominal distention, blood in stool, constipation, diarrhea, nausea and vomiting.  Genitourinary: Negative for difficulty urinating and hematuria.  Musculoskeletal: Negative for arthralgias, myalgias and neck pain.  Skin: Negative for rash.  Neurological: Negative for dizziness, seizures, syncope and headaches.  Hematological: Negative for adenopathy. Does not bruise/bleed easily.  Psychiatric/Behavioral: Negative for dysphoric mood. The patient is not nervous/anxious.        Objective:    BP 118/84 (BP Location: Right Arm, Patient Position: Sitting, Cuff Size: Normal)   Pulse 77   Temp 97.6 F (36.4 C) (Oral)   Ht _0  (1.702 m)   Wt 272 lb 8 oz (123.6 kg)   SpO2 93%   BMI 42.68 kg/m   Wt Readings from Last 3 Encounters:  08/10/17 272 lb 8 oz (123.6 kg)  08/10/17 272 lb 8 oz (123.6 kg)  03/19/17 289 lb (131.1 kg)    Physical Exam  Constitutional: He is oriented to person, place, and time. He appears well-developed and well-nourished. No distress.  Unable to get on exam table Uses cane regularly  HENT:  Head: Normocephalic and atraumatic.  Right Ear: Hearing, tympanic membrane, external ear and ear canal normal.  Left Ear: Hearing, tympanic membrane, external ear and ear canal normal.  Nose: Nose normal.  Mouth/Throat: Uvula is midline, oropharynx is clear and moist and mucous membranes are normal. No oropharyngeal exudate,  posterior oropharyngeal edema or posterior oropharyngeal erythema.  Eyes: Conjunctivae and EOM are normal. Pupils are equal, round, and reactive to light. No scleral icterus.  Neck: Normal range of motion. Neck supple.  Cardiovascular: Normal rate, regular rhythm, normal heart sounds and intact distal pulses.  No murmur heard. Pulses:      Radial pulses are 2+ on the right side, and 2+ on the left side.  Pulmonary/Chest: Effort normal and breath sounds normal. No respiratory distress. He has no wheezes. He has no rales.  Abdominal: Soft. Bowel sounds are normal. He exhibits no distension and no mass. There is no tenderness. There is no rebound and no guarding.  Musculoskeletal: Normal range of motion. He exhibits no edema.  Lymphadenopathy:    He has no cervical adenopathy.  Neurological: He is alert and oriented to person, place, and time.  CN grossly intact, station and gait intact  Skin: Skin is warm and dry. Lesion and rash noted.  R upper back with poorly healing  erythematous scab with crusting present R mid back small very dark mole present  Psychiatric: He has a normal mood and affect. His behavior is normal. Judgment and thought content normal.  Nursing note and vitals reviewed.  Results for orders placed or performed in visit on 08/10/17  POCT Urinalysis Dipstick (Automated)  Result Value Ref Range   Color, UA yellow    Clarity, UA clear    Glucose, UA negative    Bilirubin, UA negative    Ketones, UA negative    Spec Grav, UA 1.015 1.010 - 1.025   Blood, UA negative    pH, UA 6.0 5.0 - 8.0   Protein, UA negative    Urobilinogen, UA 0.2 0.2 or 1.0 E.U./dL   Nitrite, UA negative    Leukocytes, UA Negative Negative   Lab Results  Component Value Date   HGBA1C 6.2 06/30/2016       Assessment & Plan:   Problem List Items Addressed This Visit    Benign prostatic hyperplasia with urinary obstruction    Update PSA. Declines DRE. Continue finasteride.      Encounter  for general adult medical examination with abnormal findings - Primary    Preventative protocols reviewed and updated unless pt declined. Discussed healthy diet and lifestyle.       Ex-smoker    Remains abstinent.       HFrEF (heart failure with reduced ejection fraction) (HCC)    Chronic, stable on current regimen. Seems euvolemic.       Relevant Medications   lisinopril (PRINIVIL,ZESTRIL) 10 MG tablet   History of GI bleed    Back on xarelto due to high stroke risk.  H/o gastritis, esophagitis in the past. Denies symptoms at this time.  Will decrease ppi to once daily and monitor symptoms.       HLD (hyperlipidemia)    Update FLP today.   The ASCVD Risk score Mikey Bussing DC Jr., et al., 2013) failed to calculate for the following reasons:   The valid total cholesterol range is 130 to 320 mg/dL       Relevant Medications   lisinopril (PRINIVIL,ZESTRIL) 10 MG tablet   OSA (obstructive sleep apnea)    Has declined CPAP and eval.       Paroxysmal atrial fibrillation (HCC)    On sotalol, xarelto. Continue.       Relevant Medications   lisinopril (PRINIVIL,ZESTRIL) 10 MG tablet   Prediabetes    Update A1c today. Discussed limiting sugar in diet.      Renal hematoma, left, subsequent encounter   Relevant Orders   POCT Urinalysis Dipstick (Automated) (Completed)   Retroperitoneal bleed    Update CBC and Cr and UA today.  Now followed by urology. No records available at this time.       Severe obesity (BMI >= 40) (HCC)    Activity limited by unsteadiness.  Discussed decreasing sweetened beverages.      Skin lesion    Poorly healing wound R upper back. Treat as possible impetigo with mupirocin, will also refer to derm to r/o squamous cell cancer.       Relevant Orders   Ambulatory referral to Dermatology    Other Visit Diagnoses    Need for 23-polyvalent pneumococcal polysaccharide vaccine       Relevant Orders   Pneumococcal polysaccharide vaccine 23-valent  greater than or equal to 2yo subcutaneous/IM (Completed)       Meds ordered this encounter  Medications  . pantoprazole (PROTONIX) 40  MG tablet    Sig: Take 1 tablet (40 mg total) by mouth daily.    Dispense:  90 tablet    Refill:  3  . mupirocin cream (BACTROBAN) 2 %    Sig: Apply 1 application topically 2 (two) times daily.    Dispense:  15 g    Refill:  0   Orders Placed This Encounter  Procedures  . Pneumococcal polysaccharide vaccine 23-valent greater than or equal to 2yo subcutaneous/IM  . Ambulatory referral to Dermatology    Referral Priority:   Routine    Referral Type:   Consultation    Referral Reason:   Specialty Services Required    Requested Specialty:   Dermatology    Number of Visits Requested:   1  . POCT Urinalysis Dipstick (Automated)    Follow up plan: Return in about 6 months (around 02/07/2018) for follow up visit.  Ria Bush, MD

## 2017-08-10 NOTE — Assessment & Plan Note (Signed)
Chronic, stable on current regimen. Seems euvolemic.

## 2017-08-10 NOTE — Progress Notes (Signed)
Subjective:   Patrick Garner is a 67 y.o. male who presents for Medicare Annual/Subsequent preventive examination.  Review of Systems:  N/A Cardiac Risk Factors include: advanced age (>74men, >57 women);obesity (BMI >30kg/m2);male gender;dyslipidemia;hypertension     Objective:    Vitals: BP 118/84 (BP Location: Right Arm, Patient Position: Sitting, Cuff Size: Normal)   Pulse 77   Temp 97.6 F (36.4 C) (Oral)   Ht 5\' 7"  (1.702 m)   Wt 272 lb 8 oz (123.6 kg)   SpO2 93%   BMI 42.68 kg/m   Body mass index is 42.68 kg/m.  Advanced Directives 08/10/2017 06/30/2016  Does Patient Have a Medical Advance Directive? Yes Yes  Type of Paramedic of [place];Living will [place];Living will  Copy of [place] in Chart? Yes Yes    Tobacco Social History   Tobacco Use  Smoking Status Former Smoker  . Packs/day: 0.80  . Types: Cigarettes  . Last attempt to quit: 08/13/2013  . Years since quitting: 3.9  Smokeless Tobacco Never Used     Counseling given: No   Clinical Intake:  Pre-visit preparation completed: Yes  Pain : 0-10 Pain Score: 6  Pain Type: Chronic pain Pain Location: Back Pain Orientation: Right, Lower     Nutritional Status: BMI > 30  Obese Nutritional Risks: None Diabetes: No  How often do you need to have someone help you when you read instructions, pamphlets, or other written materials from your doctor or pharmacy?: 1 - Never What is the last grade level you completed in school?: 12th grade  Interpreter Needed?: No  Comments: pt lives with spouse Information entered by :: LPinson, LPN  Past Medical History:  Diagnosis Date  . Acute blood loss anemia   . AICD (automatic cardioverter/defibrillator) present   . Allergic rhinitis   . BPH (benign prostatic hypertrophy) with urinary obstruction   . 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  CAD (coronary artery disease) 2002, 2015   nonocclusive by cath 2015 - remote MI, inferior wall MI, rec medical management  . Chronic back pain    h/o spinal fusion with rods/screw, on morphine/gabapentin  . Chronic narcotic use    tapered off  . GI bleed 12/2013   hospitalization s/p unrevealing EGD/colonoscopy  . History of Bell's palsy   . HLD (hyperlipidemia)   . HTN (hypertension)   . Insomnia   . OSA (obstructive sleep apnea)    refuses sleep eval  . 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 Paroxysmal atrial fibrillation ([place]) 10/2013   new onset  . Peripheral neuropathy   . PUD (peptic ulcer disease) 1980s   via EGD  . Pulmonary HTN ([place]) 10/2013  . RBBB 2002  . RLS (restless legs syndrome)   . Smoker   . Systolic CHF ([place]) 06/7508   EF 27% on 2d lexisccan cardiolite stress test [facility])   Past Surgical History:  Procedure Laterality Date  . BACK SURGERY  2002   plate placed  . CARDIAC CATHETERIZATION  10/2013   diffuse CAD, no sites for PCI, rec against CABG  . CARDIOVASCULAR STRESS TEST  2002   Adenosine Cardiolite with prior small inferior infarct, EF 59%  . CARDIOVASCULAR STRESS TEST  10/2013   lexiscan cardiolite - mod sized nontransmural infarct in inferior wall with small mild intensity peri-infarct ischemia, likely pulm HTN,  akinesis of inferior wall with marked hypokinesis of LV and EF 27%  . HERNIA REPAIR  2005  . KNEE SURGERY  2008  . PACEMAKER INSERTION  01/04/2014   AICD dualchamber PPM/ICD [employer] Jude [id number], serial [id number]  . TONSILLECTOMY  1950's  . US ECHOCARDIOGRAPHY  12/2013   at [place] - EF 25-35% with mod global hypokinesis   Family History  Problem Relation Age of Onset  . Heart disease Father        CHF  . Cancer Father        unsure type  . Diabetes Paternal Aunt   . Diabetes Paternal Uncle   . Stroke Neg Hx   . CAD Neg Hx    Social History   Socioeconomic History  . Marital status: Married    Spouse name: None  .  Number of children: None  . Years of education: None  . Highest education level: None  Social Needs  . Financial resource strain: None  . Food insecurity - worry: None  . Food insecurity - inability: None  . Transportation needs - medical: None  . Transportation needs - non-medical: None  Occupational History  . None  Tobacco Use  . Smoking status: Former Smoker    Packs/day: 0.80    Types: Cigarettes    Last attempt to quit: 08/13/2013    Years since quitting: 3.9  . Smokeless tobacco: Never Used  Substance and Sexual Activity  . Alcohol use: No    Alcohol/week: 0.0 oz  . Drug use: No  . Sexual activity: No  Other Topics Concern  . None  Social History Narrative   Caffeine: rare   Lives with wife, 1 cat   Live in [place], commutes for medical care.  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 amitriptyline (ELAVIL) 25 MG tablet Take 1 tablet (25 mg total) by mouth at bedtime.  Marland Kitchen atorvastatin (LIPITOR) 80 MG tablet Take 80 mg by mouth daily.  . finasteride (PROSCAR) 5 MG tablet TAKE ONE TABLET BY MOUTH DAILY  . furosemide (LASIX) 40 MG tablet Take 1 tablet (40 mg total) by mouth daily.  Marland Kitchen gabapentin (NEURONTIN) 300 MG capsule Take 1 capsule (300 mg total) by mouth 2 (two) times daily.  . Multiple Vitamin (MULTIVITAMIN) tablet Take 1 tablet by mouth daily.  . potassium chloride (K-DUR,KLOR-CON) 10 MEQ tablet Take 1 tablet (10 mEq total) by mouth daily.  . rivaroxaban (XARELTO) 20 MG TABS tablet Take 20 mg by mouth daily with supper.  . sotalol (BETAPACE) 120 MG tablet Take 1 tablet (120 mg total) by mouth 2 (two) times daily.  . [DISCONTINUED] lisinopril (PRINIVIL,ZESTRIL) 5 MG tablet Take 10 mg by mouth daily.   .  [DISCONTINUED] pantoprazole (PROTONIX) 40 MG tablet Take 1 tablet (40 mg total) by mouth 2 (two) times daily.   No facility-administered encounter medications on file as of 08/10/2017.     Activities of Daily Living In your present state of health, do you have any difficulty performing the following activities: 08/10/2017  Hearing? N  Vision? N  Difficulty concentrating or making decisions? N  Walking or climbing stairs? Y  Dressing or bathing? N  Doing errands, shopping? N  Preparing Food and eating ?  N  Using the Toilet? N  In the past six months, have you accidently leaked urine? N  Do you have problems with loss of bowel control? N  Managing your Medications? Y  Managing your Finances? N  Housekeeping or managing your Housekeeping? N  Some recent data might be hidden    Patient Care Team: Ria Bush, MD as PCP - General (Family Medicine)   Assessment:   This is a routine wellness examination for Patrick Garner.   Hearing Screening   125Hz  250Hz  500Hz  1000Hz  2000Hz  3000Hz  4000Hz  6000Hz  8000Hz   Right ear:   40 40 40  40    Left ear:   40 40 40  40    Vision Screening Comments: Last vision exam on Feb 12 @ [facility]. Altamease Oiler  Exercise Activities and Dietary recommendations Current Exercise Habits: The patient does not participate in regular exercise at present, Exercise limited by: orthopedic condition(s)  Goals    . Follow up with Primary Care Provider     Starting 08/10/2017, I will continue to take medications as prescribed and to keep appointments with PCP as scheduled.        Fall Risk Fall Risk  08/10/2017 06/30/2016 11/14/2014 08/29/2013 05/24/2012  Falls in the past year? Yes No Yes No Yes  Comment fell after LOC - - - -  Number falls in past yr: 1 - 2 or more - 1  Injury with Fall? Yes - No - No  Risk Factor Category  - - High Fall Risk - -  Risk for fall due to : - - Impaired balance/gait;Impaired mobility - -  Risk for fall due to: Comment - - Uses cane - -    Depression Screen PHQ 2/9 Scores 08/10/2017 06/30/2016 06/30/2016 11/14/2014  PHQ - 2 Score 0 4 4 2   PHQ- 9 Score 0 10 10 9     Cognitive Function MMSE - Mini Mental State Exam 08/10/2017 06/30/2016  Orientation to time 5 5  Orientation to Place 5 5  Registration 3 3  Attention/ Calculation 0 0  Recall 3 3  Language- name 2 objects 0 0  Language- repeat 1 1  Language- follow 3 step command 3 3  Language- read & follow direction 0 0  Write a sentence 0 0  Copy design 0 0  Total score 20 20       PLEASE NOTE: A Mini-Cog screen was completed. Maximum score is 20. A value of 0 denotes this part of [place] MMSE was not completed or the patient failed this part of the Mini-Cog screening.   Mini-Cog Screening Orientation to Time - Max 5 pts Orientation to Place - Max 5 pts Registration - Max 3 pts Recall - Max 3 pts Language Repeat - Max 1 pts Language Follow 3 Step Command - Max 3 pts   Immunization History  Administered Date(s) Administered  . Influenza Split 02/24/2012  . Influenza,inj,Quad PF,6+ Mos 02/12/2014, 06/19/2015, 03/19/2017  . Influenza-Unspecified 04/15/2016  . Pneumococcal Conjugate-13 11/14/2014  . Pneumococcal Polysaccharide-23 05/24/2012, 08/10/2017  . Td 06/15/2006   Screening Tests Health Maintenance  Topic Date Due  . DTaP/Tdap/Td (1 - Tdap) 08/10/2018 (Originally 06/16/2006)  . TETANUS/TDAP  08/10/2018 (Originally 07/16/2016)  . PNA vac Low Risk Adult (2 of 2 - PPSV23) 08/10/2018 (Originally 05/24/2017)  . COLONOSCOPY  01/11/2024  . INFLUENZA VACCINE  Completed  . Hepatitis C Screening  Completed       Plan:     I have personally reviewed, addressed,  and noted the following in the patient's chart:  A. Medical and social history B. Use of alcohol, tobacco or illicit drugs  C. Current medications and supplements D. Functional ability and status E.  Nutritional status F.  Physical activity G. Advance directives H. List of other physicians I.    Hospitalizations, surgeries, and ER visits in previous 12 months J.  [place] to include hearing, vision, cognitive, depression L. Referrals and appointments - none  In addition, I have reviewed and discussed with patient certain preventive protocols, quality metrics, and best practice recommendations. A written personalized care plan for preventive services as well as general preventive health recommendations were provided to patient.  See attached scanned questionnaire for additional information.   Signed,   Lindell Noe, MHA, BS, LPN Health Coach

## 2017-08-10 NOTE — Assessment & Plan Note (Signed)
Preventative protocols reviewed and updated unless pt declined. Discussed healthy diet and lifestyle.  

## 2017-08-10 NOTE — Assessment & Plan Note (Signed)
Activity limited by unsteadiness.  Discussed decreasing sweetened beverages.

## 2017-08-10 NOTE — Patient Instructions (Signed)
Patrick Garner , Thank you for taking time to come for your Medicare Wellness Visit. I appreciate your ongoing commitment to your health goals. Please review the following plan we discussed and let me know if I can assist you in the future.   These are the goals we discussed: Goals    . Follow up with Primary Care Provider     Starting 08/10/2017, I will continue to take medications as prescribed and to keep appointments with PCP as scheduled.        This is a list of the screening recommended for you and due dates:  Health Maintenance  Topic Date Due  . DTaP/Tdap/Td vaccine (1 - Tdap) 08/10/2018*  . Tetanus Vaccine  08/10/2018*  . Pneumonia vaccines (2 of 2 - PPSV23) 08/10/2018*  . Colon Cancer Screening  01/11/2024  . Flu Shot  Completed  .  Hepatitis C: One time screening is recommended by Center for Disease Control  (CDC) for  adults born from [birth year] through 1966.   Completed  *Topic was postponed. The date shown is not the original due date.   Preventive Care for Adults  A healthy lifestyle and preventive care can promote health and wellness. Preventive health guidelines for adults include the following key practices.  . A routine yearly physical is a good way to check with your health care provider about your health and preventive screening. It is a chance to share any concerns and updates on your health and to receive a thorough exam.  . Visit your dentist for a routine exam and preventive care every 6 months. Brush your teeth twice a day and floss once a day. Good oral hygiene prevents tooth decay and gum disease.  . The frequency of eye exams is based on your age, health, family medical history, use  of contact lenses, and other factors. Follow your health care provider's recommendations for frequency of eye exams.  . Eat a healthy diet. Foods like vegetables, fruits, whole grains, low-fat dairy products, and lean protein foods contain the nutrients you need without too many  calories. Decrease your intake of foods high in solid fats, added sugars, and salt. Eat the right amount of calories for you. Get information about a proper diet from your health care provider, if necessary.  . Regular physical exercise is one of the most important things you can do for your health. Most adults should get at least 150 minutes of moderate-intensity exercise (any activity that increases your heart rate and causes you to sweat) each week. In addition, most adults need muscle-strengthening exercises on 2 or more days a week.  Silver Sneakers may be a benefit available to you. To determine eligibility, you may visit the website: www.silversneakers.com or contact program at 2521712689 Mon-Fri between 8AM-8PM.   . Maintain a healthy weight. The body mass index (BMI) is a screening tool to identify possible weight problems. It provides an estimate of body fat based on height and weight. Your health care provider can find your BMI and can help you achieve or maintain a healthy weight.   For adults 20 years and older: ? A BMI below 18.5 is considered underweight. ? A BMI of 18.5 to 24.9 is normal. ? A BMI of 25 to 29.9 is considered overweight. ? A BMI of 30 and above is considered obese.   . Maintain normal blood lipids and cholesterol levels by exercising and minimizing your intake of saturated fat. Eat a balanced diet with plenty of fruit and  vegetables. Blood tests for lipids and cholesterol should begin at age 47 and be repeated every 5 years. If your lipid or cholesterol levels are high, you are over 50, or you are at high risk for heart disease, you may need your cholesterol levels checked more frequently. Ongoing high lipid and cholesterol levels should be treated with medicines if diet and exercise are not working.  . If you smoke, find out from your health care provider how to quit. If you do not use tobacco, please do not start.  . If you choose to drink alcohol, please do  not consume more than 2 drinks per day. One drink is considered to be 12 ounces (355 mL) of beer, 5 ounces (148 mL) of wine, or 1.5 ounces (44 mL) of liquor.  . If you are 35-26 years old, ask your health care provider if you should take aspirin to prevent strokes.  . Use sunscreen. Apply sunscreen liberally and repeatedly throughout the day. You should seek shade when your shadow is shorter than you. Protect yourself by wearing long sleeves, pants, a wide-brimmed hat, and sunglasses year round, whenever you are outdoors.  . Once a month, do a whole body skin exam, using a mirror to look at the skin on your back. Tell your health care provider of new moles, moles that have irregular borders, moles that are larger than a pencil eraser, or moles that have changed in shape or color.

## 2017-08-10 NOTE — Assessment & Plan Note (Signed)
Update A1c today. Discussed limiting sugar in diet.

## 2017-08-10 NOTE — Assessment & Plan Note (Addendum)
Back on xarelto due to high stroke risk.  H/o gastritis, esophagitis in the past. Denies symptoms at this time.  Will decrease ppi to once daily and monitor symptoms.

## 2017-08-10 NOTE — Assessment & Plan Note (Signed)
Remains abstinent 

## 2017-08-10 NOTE — Assessment & Plan Note (Signed)
Poorly healing wound R upper back. Treat as possible impetigo with mupirocin, will also refer to derm to r/o squamous cell cancer.

## 2017-08-13 ENCOUNTER — Telehealth: Payer: Self-pay

## 2017-08-13 MED ORDER — MUPIROCIN 2 % EX OINT: 1.0000 "application " | TOPICAL_OINTMENT | Freq: Two times a day (BID) | CUTANEOUS | 0 refills | Status: DC

## 2017-08-13 NOTE — Telephone Encounter (Signed)
Started PA for mupirocin 2% cream, key:  [id number], PA case ID:  [id number].   Message says alternative formulary drugs are mupirocin ointment and gentamicin cream.

## 2017-08-13 NOTE — Telephone Encounter (Signed)
I have sent in mupirocin ointment.

## 2017-08-15 NOTE — Progress Notes (Signed)
I reviewed health advisor's note, was available for consultation, and agree with documentation and plan.  

## 2017-08-24 ENCOUNTER — Telehealth: Payer: Self-pay | Admitting: Family Medicine

## 2017-08-24 NOTE — Telephone Encounter (Signed)
Copied from [place]. Topic: Quick Communication - Rx Refill/Question >> Aug 24, 2017  2:57 PM Waylan Rocher, Lumin L wrote: Medication: gabapentin (NEURONTIN) 300 MG capsule Has the patient contacted their pharmacy? Yes.   (Agent: If no, request that the patient contact the pharmacy for the refill.) Preferred Pharmacy (with phone number or street name): [facility] [place], [place] [address] [place] [address] Phone: [phone] Fax: [phone] Agent: Please be advised that RX refills may take up to 3 business days. We ask that you follow-up with your pharmacy.

## 2017-08-25 ENCOUNTER — Other Ambulatory Visit: Payer: Self-pay | Admitting: *Deleted

## 2017-08-25 MED ORDER — GABAPENTIN 300 MG PO CAPS: 300.0000 mg | ORAL_CAPSULE | Freq: Two times a day (BID) | ORAL | 1 refills | Status: DC

## 2017-08-25 NOTE — Telephone Encounter (Signed)
E-rx failed - plz phone in and talk with pharmacist - may need more info?

## 2017-08-25 NOTE — Progress Notes (Unsigned)
Charted in error.

## 2017-08-25 NOTE — Addendum Note (Signed)
Addended by: Ria Bush on: 08/25/2017 09:04 PM   Modules accepted: Orders

## 2017-08-25 NOTE — Telephone Encounter (Signed)
Gabapentin LOV: 08/10/17 PCP: Dr Danise Mina Pharmacy: [facility]

## 2017-08-25 NOTE — Addendum Note (Signed)
Addended by: Helene Shoe on: 08/25/2017 09:43 AM   Modules accepted: Orders

## 2017-08-25 NOTE — Telephone Encounter (Signed)
I spoke with Patrick Garner at [facility] in [place] and gabapentin refills expire after 6 months in [place]. Pt annual exam on 08/10/17.

## 2017-08-26 NOTE — Telephone Encounter (Signed)
Spoke with pharmacist with [facility] and was told pt's gabapentin rx had expired so he needed a new one. I provided rx info.  Says they will get it filled for pt.

## 2017-09-06 ENCOUNTER — Encounter: Payer: Self-pay | Admitting: Family Medicine

## 2017-09-18 ENCOUNTER — Encounter: Payer: Self-pay | Admitting: Family Medicine

## 2017-09-18 DIAGNOSIS — H34832 Tributary (branch) retinal vein occlusion, left eye, with macular edema: Secondary | ICD-10-CM | POA: Insufficient documentation

## 2017-09-18 DIAGNOSIS — H348392 Tributary (branch) retinal vein occlusion, unspecified eye, stable: Secondary | ICD-10-CM | POA: Insufficient documentation

## 2017-10-21 ENCOUNTER — Other Ambulatory Visit: Payer: Self-pay | Admitting: Family Medicine

## 2017-10-30 ENCOUNTER — Other Ambulatory Visit: Payer: Self-pay | Admitting: Family Medicine

## 2017-11-09 ENCOUNTER — Other Ambulatory Visit: Payer: Self-pay | Admitting: Family Medicine

## 2018-02-04 ENCOUNTER — Ambulatory Visit (INDEPENDENT_AMBULATORY_CARE_PROVIDER_SITE_OTHER)
Admission: RE | Admit: 2018-02-04 | Discharge: 2018-02-04 | Disposition: A | Discharge status: Home / Self Care | Payer: Medicare PPO | Source: Ambulatory Visit | Attending: Family Medicine | Admitting: Family Medicine

## 2018-02-04 ENCOUNTER — Encounter: Payer: Self-pay | Admitting: Family Medicine

## 2018-02-04 ENCOUNTER — Ambulatory Visit: Payer: Medicare PPO | Admitting: Family Medicine

## 2018-02-04 VITALS — BP 130/80 | HR 57 | Temp 97.8°F | Ht 67.0 in | Wt 278.8 lb

## 2018-02-04 DIAGNOSIS — M545 Low back pain: Secondary | ICD-10-CM

## 2018-02-04 DIAGNOSIS — I480 Paroxysmal atrial fibrillation: Secondary | ICD-10-CM

## 2018-02-04 DIAGNOSIS — E785 Hyperlipidemia, unspecified: Secondary | ICD-10-CM | POA: Diagnosis not present

## 2018-02-04 DIAGNOSIS — M79642 Pain in left hand: Secondary | ICD-10-CM

## 2018-02-04 DIAGNOSIS — M79641 Pain in right hand: Secondary | ICD-10-CM

## 2018-02-04 DIAGNOSIS — I5022 Chronic systolic (congestive) heart failure: Secondary | ICD-10-CM | POA: Diagnosis not present

## 2018-02-04 DIAGNOSIS — R58 Hemorrhage, not elsewhere classified: Secondary | ICD-10-CM

## 2018-02-04 DIAGNOSIS — C4490 Unspecified malignant neoplasm of skin, unspecified: Secondary | ICD-10-CM | POA: Diagnosis not present

## 2018-02-04 DIAGNOSIS — S37012D Minor contusion of left kidney, subsequent encounter: Secondary | ICD-10-CM

## 2018-02-04 DIAGNOSIS — G8929 Other chronic pain: Secondary | ICD-10-CM

## 2018-02-04 DIAGNOSIS — F39 Unspecified mood [affective] disorder: Secondary | ICD-10-CM | POA: Insufficient documentation

## 2018-02-04 LAB — CBC WITH DIFFERENTIAL/PLATELET
Basophils Absolute: 0 10*3/uL (ref 0.0–0.1)
Basophils Relative: 0.6 % (ref 0.0–3.0)
Eosinophils Absolute: 0.3 10*3/uL (ref 0.0–0.7)
Eosinophils Relative: 4.2 % (ref 0.0–5.0)
HCT: 49.6 % (ref 39.0–52.0)
Hemoglobin: 16.8 g/dL (ref 13.0–17.0)
LYMPHS ABS: 1.7 10*3/uL (ref 0.7–4.0)
Lymphocytes Relative: 20.4 % (ref 12.0–46.0)
MCHC: 34 g/dL (ref 30.0–36.0)
MCV: 87.2 fl (ref 78.0–100.0)
MONO ABS: 0.5 10*3/uL (ref 0.1–1.0)
Monocytes Relative: 5.7 % (ref 3.0–12.0)
NEUTROS ABS: 5.7 10*3/uL (ref 1.4–7.7)
NEUTROS PCT: 69.1 % (ref 43.0–77.0)
PLATELETS: 172 10*3/uL (ref 150.0–400.0)
RBC: 5.69 Mil/uL (ref 4.22–5.81)
RDW: 15.1 % (ref 11.5–15.5)
WBC: 8.3 10*3/uL (ref 4.0–10.5)

## 2018-02-04 LAB — COMPREHENSIVE METABOLIC PANEL
ALK PHOS: 111 U/L (ref 39–117)
ALT: 14 U/L (ref 0–53)
AST: 17 U/L (ref 0–37)
Albumin: 4 g/dL (ref 3.5–5.2)
BUN: 13 mg/dL (ref 6–23)
CO2: 33 meq/L — AB (ref 19–32)
Calcium: 9.3 mg/dL (ref 8.4–10.5)
Chloride: 103 mEq/L (ref 96–112)
Creatinine, Ser: 1.2 mg/dL (ref 0.40–1.50)
GFR: 63.93 mL/min (ref >60.00)
GLUCOSE: 153 mg/dL — AB (ref 70–99)
Potassium: 2.9 mEq/L — ABNORMAL LOW (ref 3.5–5.1)
SODIUM: 145 meq/L (ref 135–145)
TOTAL PROTEIN: 6.4 g/dL (ref 6.0–8.3)
Total Bilirubin: 1.9 mg/dL — ABNORMAL HIGH (ref 0.2–1.2)

## 2018-02-04 LAB — LDL CHOLESTEROL, DIRECT: Direct LDL: 59 mg/dL

## 2018-02-04 LAB — SEDIMENTATION RATE: SED RATE: 10 mm/h (ref 0–20)

## 2018-02-04 MED ORDER — GABAPENTIN 300 MG PO CAPS: 300.0000 mg | ORAL_CAPSULE | Freq: Two times a day (BID) | ORAL | 3 refills | Status: DC

## 2018-02-04 MED ORDER — SERTRALINE HCL 50 MG PO TABS: 50.0000 mg | ORAL_TABLET | Freq: Every day | ORAL | 3 refills | Status: DC

## 2018-02-04 MED ORDER — AMITRIPTYLINE HCL 25 MG PO TABS: 25.0000 mg | ORAL_TABLET | Freq: Every day | ORAL | 3 refills | Status: DC

## 2018-02-04 NOTE — Assessment & Plan Note (Signed)
S/p excision by cards, has f/u 04/2018.

## 2018-02-04 NOTE — Assessment & Plan Note (Signed)
Wife endorses worsening mood. Will change to sertraline, stop amitriptyline.

## 2018-02-04 NOTE — Patient Instructions (Addendum)
Recheck weight.  Labs today.  Xray today. We will fax lab results to cardiology office.  Return as needed or 6 months for physical/wellness visit  Let's stop amitritpyline, in its place start sertraline (zoloft) mood medicine 50mg  daily.

## 2018-02-04 NOTE — Assessment & Plan Note (Signed)
Check d LDL today.  The 10-year ASCVD risk score Mikey Bussing DC Brooke Bonito., et al., 2013) is: 17.5%   Values used to calculate the score:     Age: 67 years     Sex: Male     Is Non-Hispanic African American: No     Diabetic: No     Tobacco smoker: No     Systolic Blood Pressure: 300 mmHg     Is BP treated: Yes     HDL Cholesterol: 35.3 mg/dL     Total Cholesterol: 131 mg/dL

## 2018-02-04 NOTE — Assessment & Plan Note (Signed)
Recheck weight

## 2018-02-04 NOTE — Assessment & Plan Note (Signed)
Will stop amitriptyline - see above.

## 2018-02-04 NOTE — Assessment & Plan Note (Addendum)
Describes arthritis pain - anticipate OA.  However just isolated to hands - will check RF, ESR, hand xrays today to eval for RA.

## 2018-02-04 NOTE — Assessment & Plan Note (Addendum)
Stable period, euvolemic. Doing well on daily lasix.

## 2018-02-04 NOTE — Assessment & Plan Note (Signed)
Did not f/u with uro - declined rpt imaging. Check CBC today.

## 2018-02-04 NOTE — Assessment & Plan Note (Signed)
Did not f/u with uro - declined rpt imaging.  Check CBC today.  Overall stable period.

## 2018-02-04 NOTE — Assessment & Plan Note (Signed)
Continue sotalol, xarelto

## 2018-02-04 NOTE — Progress Notes (Signed)
BP 130/80 (BP Location: Left Arm, Patient Position: Sitting, Cuff Size: Large)   Pulse (!) 57   Temp 97.8 F (36.6 C) (Oral)   Ht _0  (1.702 m)   Wt 278 lb 12 oz (126.4 kg)   SpO2 96%   BMI 43.66 kg/m    CC: 6 mo f/u visit Subjective:    Patient ID: Patrick Garner, male    DOB: [birth year], 67 y.o.   MRN: [id number]  HPI: Patrick Garner is a 67 y.o. male presenting on 02/04/2018 for 6 mo follow up   Lives in [place], drives 3 hours to appt.  Known PAF, sCHF, diffuse CAD with cardiomyopathy and pacemaker. H/o GI bleed s/p colonoscopy. On xarelto due to high stroke risk. Takes PPI BID. Sees cardiology [facility] ([place]). Has not had urology f/u for retroperitoneal bleed - declines. Denies dizziness, nausea.   Requests labs sent to cardiology attn Karie Georges (ph [phone]) at [facility] in [place].   18 lb weight loss on our scale. Snacks during day, eats diner.  Not fasting today.   Notes trouble with stiffness and hand pain throughout the day. No redness or swelling or warmth of joints. Predominant pain at DIPs, also MCPs. All fingers get stuck intermittently. Takes tylenol arthritis for hand pain. No other joint pains.  Relevant past medical, surgical, family and social history reviewed and updated as indicated. Interim medical history since our last visit reviewed. Allergies and medications reviewed and updated. Outpatient Medications Prior to Visit  Medication Sig Dispense Refill  . atorvastatin (LIPITOR) 80 MG tablet Take 80 mg by mouth daily.    . finasteride (PROSCAR) 5 MG tablet TAKE ONE TABLET BY MOUTH DAILY 90 tablet 2  . furosemide (LASIX) 40 MG tablet Take 1 tablet (40 mg total) by mouth daily.    Marland Kitchen lisinopril (PRINIVIL,ZESTRIL) 10 MG tablet Take 1 tablet (10 mg total) by mouth daily.    . Multiple Vitamin (MULTIVITAMIN) tablet Take 1 tablet by mouth daily.    . mupirocin ointment (BACTROBAN) 2 % Apply 1 application topically 2 (two) times  daily. 22 g 0  . pantoprazole (PROTONIX) 40 MG tablet Take 1 tablet (40 mg total) by mouth daily. 90 tablet 3  . potassium chloride (K-DUR,KLOR-CON) 10 MEQ tablet Take 1 tablet (10 mEq total) by mouth daily.    . rivaroxaban (XARELTO) 20 MG TABS tablet Take 20 mg by mouth daily with supper.    . sotalol (BETAPACE) 120 MG tablet Take 1 tablet (120 mg total) by mouth 2 (two) times daily.    Marland Kitchen amitriptyline (ELAVIL) 25 MG tablet TAKE ONE TABLET BY MOUTH EVERY NIGHT AT BEDTIME 90 tablet 0  . gabapentin (NEURONTIN) 300 MG capsule Take 1 capsule (300 mg total) by mouth 2 (two) times daily. 180 capsule 1  . amitriptyline (ELAVIL) 25 MG tablet Take 1 tablet (25 mg total) by mouth at bedtime.    . pantoprazole (PROTONIX) 40 MG tablet TAKE ONE TABLET BY MOUTH TWICE A DAY 180 tablet 2   No facility-administered medications prior to visit.      Per HPI unless specifically indicated in ROS section below Review of Systems     Objective:    BP 130/80 (BP Location: Left Arm, Patient Position: Sitting, Cuff Size: Large)   Pulse (!) 57   Temp 97.8 F (36.6 C) (Oral)   Ht _1  (1.702 m)   Wt 278 lb 12 oz (126.4 kg)  SpO2 96%   BMI 43.66 kg/m   Wt Readings from Last 3 Encounters:  02/04/18 278 lb 12 oz (126.4 kg)  08/10/17 272 lb 8 oz (123.6 kg)  08/10/17 272 lb 8 oz (123.6 kg)    Physical Exam  Constitutional: He appears well-developed and well-nourished. No distress.  HENT:  Mouth/Throat: Oropharynx is clear and moist. No oropharyngeal exudate.  Cardiovascular: Normal rate, regular rhythm and normal heart sounds.  No murmur heard. Pulmonary/Chest: Effort normal and breath sounds normal. No respiratory distress. He has no wheezes. He has no rales.  Musculoskeletal: Normal range of motion. He exhibits no edema.  Mild tenderness to palpation throughout all MCPs, IPs without active synovitis.   Neurological:  Diminished grip strength bilaterally  Skin: Skin is warm and dry. No rash noted.    Nursing note and vitals reviewed.  Results for orders placed or performed in visit on 08/10/17  CBC with Differential  Result Value Ref Range   WBC 8.2 4.0 - 10.5 K/uL   RBC 5.69 4.22 - 5.81 Mil/uL   Hemoglobin 16.4 13.0 - 17.0 g/dL   HCT 48.0 39.0 - 52.0 %   MCV 84.3 78.0 - 100.0 fl   MCHC 34.1 30.0 - 36.0 g/dL   RDW 16.0 (H) 11.5 - 15.5 %   Platelets 174.0 150.0 - 400.0 K/uL   Neutrophils Relative % 66.6 43.0 - 77.0 %   Lymphocytes Relative 22.8 12.0 - 46.0 %   Monocytes Relative 6.1 3.0 - 12.0 %   Eosinophils Relative 3.9 0.0 - 5.0 %   Basophils Relative 0.6 0.0 - 3.0 %   Neutro Abs 5.5 1.4 - 7.7 K/uL   Lymphs Abs 1.9 0.7 - 4.0 K/uL   Monocytes Absolute 0.5 0.1 - 1.0 K/uL   Eosinophils Absolute 0.3 0.0 - 0.7 K/uL   Basophils Absolute 0.0 0.0 - 0.1 K/uL   Lab Results  Component Value Date   HGBA1C 5.7 08/10/2017       Assessment & Plan:   Problem List Items Addressed This Visit    Skin cancer    S/p excision by cards, has f/u 04/2018.      Severe obesity (BMI >= 40) (HCC)    Recheck weight.      Retroperitoneal bleed    Did not f/u with uro - declined rpt imaging. Check CBC today.       Renal hematoma, left, subsequent encounter    Did not f/u with uro - declined rpt imaging.  Check CBC today.  Overall stable period.       Paroxysmal atrial fibrillation (HCC)    Continue sotalol, xarelto      Pain in both hands - Primary    Describes arthritis pain - anticipate OA.  However just isolated to hands - will check RF, ESR, hand xrays today to eval for RA.       Relevant Orders   CBC with Differential/Platelet   Sedimentation rate   Rheumatoid factor   DG Hand Complete Left   DG Hand Complete Right   Mood disorder (HCC)    Wife endorses worsening mood. Will change to sertraline, stop amitriptyline.       HLD (hyperlipidemia)    Check d LDL today.  The 10-year ASCVD risk score Mikey Bussing DC Brooke Bonito., et al., 2013) is: 17.5%   Values used to calculate the  score:     Age: 48 years     Sex: Male     Is Non-Hispanic African American: No  Diabetic: No     Tobacco smoker: No     Systolic Blood Pressure: 355 mmHg     Is BP treated: Yes     HDL Cholesterol: 35.3 mg/dL     Total Cholesterol: 131 mg/dL       Relevant Orders   Comprehensive metabolic panel   LDL Cholesterol, Direct   HFrEF (heart failure with reduced ejection fraction) (HCC)    Stable period, euvolemic. Doing well on daily lasix.       Relevant Orders   CBC with Differential/Platelet   Chronic back pain    Will stop amitriptyline - see above.           Meds ordered this encounter  Medications  . DISCONTD: amitriptyline (ELAVIL) 25 MG tablet    Sig: Take 1 tablet (25 mg total) by mouth at bedtime.    Dispense:  90 tablet    Refill:  3  . DISCONTD: gabapentin (NEURONTIN) 300 MG capsule    Sig: Take 1 capsule (300 mg total) by mouth 2 (two) times daily.    Dispense:  180 capsule    Refill:  3  . gabapentin (NEURONTIN) 300 MG capsule    Sig: Take 1 capsule (300 mg total) by mouth 2 (two) times daily.    Dispense:  180 capsule    Refill:  3  . sertraline (ZOLOFT) 50 MG tablet    Sig: Take 1 tablet (50 mg total) by mouth daily.    Dispense:  90 tablet    Refill:  3    Stop amitriptyline - take sertraline in its place   Orders Placed This Encounter  Procedures  . DG Hand Complete Left    Standing Status:   Future    Number of Occurrences:   1    Standing Expiration Date:   04/07/2019    Order Specific Question:   Reason for Exam (SYMPTOM  OR DIAGNOSIS REQUIRED)    Answer:   bilateral hand pain at IPs and MCP mointj    Order Specific Question:   Preferred imaging location?    Answer:   [facility]    Order Specific Question:   Radiology Contrast Protocol - do NOT remove file path    Answer:   \\[email or web address]\DXFluoroContrastProtocols.pdf  . DG Hand Complete Right    Standing Status:   Future    Number of Occurrences:   1     Standing Expiration Date:   04/07/2019    Order Specific Question:   Reason for Exam (SYMPTOM  OR DIAGNOSIS REQUIRED)    Answer:   bilateral hand pain at IPs and MCP mointj    Order Specific Question:   Preferred imaging location?    Answer:   [facility]    Order Specific Question:   Radiology Contrast Protocol - do NOT remove file path    Answer:   \\[email or web address]\Radiant\DXFluoroContrastProtocols.pdf  . Comprehensive metabolic panel  . CBC with Differential/Platelet  . Sedimentation rate  . Rheumatoid factor  . LDL Cholesterol, Direct    Follow up plan: Return in about 6 months (around 08/07/2018) for annual exam, prior fasting for blood work, medicare wellness visit.  Ria Bush, MD

## 2018-02-05 ENCOUNTER — Other Ambulatory Visit: Payer: Self-pay | Admitting: Family Medicine

## 2018-02-05 LAB — RHEUMATOID FACTOR

## 2018-02-05 MED ORDER — POTASSIUM CHLORIDE CRYS ER 10 MEQ PO TBCR: 10.0000 meq | EXTENDED_RELEASE_TABLET | Freq: Two times a day (BID) | ORAL | 1 refills | Status: DC

## 2018-02-06 ENCOUNTER — Encounter: Payer: Self-pay | Admitting: Family Medicine

## 2018-03-02 ENCOUNTER — Telehealth: Payer: Self-pay | Admitting: *Deleted

## 2018-03-02 NOTE — Telephone Encounter (Signed)
Copied from [place] [id number]. 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

## 2018-03-04 NOTE — Telephone Encounter (Signed)
plz prepare letter:  Patrick Garner is a patient of mine who needs to use a walker due to peripheral neuropathy, chronic back pain, and history of falls. If you have any questions, feel free to contact my office.

## 2018-03-04 NOTE — Telephone Encounter (Signed)
Spoke with pt's wife, Jana Half (on dpr), notifying her the letter has been mailed. She expresses her thanks.

## 2018-07-07 ENCOUNTER — Encounter: Payer: Self-pay | Admitting: Family Medicine

## 2018-07-25 ENCOUNTER — Other Ambulatory Visit: Payer: Self-pay | Admitting: Family Medicine

## 2018-08-29 ENCOUNTER — Other Ambulatory Visit: Payer: Self-pay | Admitting: Family Medicine

## 2018-08-30 NOTE — Telephone Encounter (Signed)
Last office visit 02/04/2018 for 6 month follow up.  Last refilled 02/04/2018 for #180 with 3 refills. Refill request is coming from [facility].  Next Appt: 09/01/2018 for CPE.

## 2018-08-30 NOTE — Telephone Encounter (Signed)
Gabapentin Last filled:  05/30/18, #180 Last OV:  02/04/18, f/u Next OV:  09/01/18, CPE Pt 2

## 2018-09-01 ENCOUNTER — Encounter: Payer: Medicare PPO | Admitting: Family Medicine

## 2018-09-01 ENCOUNTER — Ambulatory Visit: Payer: Medicare PPO

## 2018-10-17 ENCOUNTER — Other Ambulatory Visit: Payer: Self-pay | Admitting: Family Medicine

## 2018-11-01 ENCOUNTER — Other Ambulatory Visit: Payer: Self-pay | Admitting: Family Medicine

## 2019-01-20 ENCOUNTER — Ambulatory Visit: Payer: Medicare PPO

## 2019-01-20 ENCOUNTER — Ambulatory Visit (INDEPENDENT_AMBULATORY_CARE_PROVIDER_SITE_OTHER): Payer: Medicare PPO | Admitting: Family Medicine

## 2019-01-20 ENCOUNTER — Encounter: Payer: Self-pay | Admitting: Family Medicine

## 2019-01-20 ENCOUNTER — Other Ambulatory Visit: Payer: Self-pay

## 2019-01-20 VITALS — BP 126/84 | HR 66 | Temp 99.0°F | Ht 67.5 in | Wt 281.1 lb

## 2019-01-20 DIAGNOSIS — R809 Proteinuria, unspecified: Secondary | ICD-10-CM | POA: Insufficient documentation

## 2019-01-20 DIAGNOSIS — Z0000 Encounter for general adult medical examination without abnormal findings: Secondary | ICD-10-CM

## 2019-01-20 DIAGNOSIS — I2510 Atherosclerotic heart disease of native coronary artery without angina pectoris: Secondary | ICD-10-CM

## 2019-01-20 DIAGNOSIS — F39 Unspecified mood [affective] disorder: Secondary | ICD-10-CM

## 2019-01-20 DIAGNOSIS — N138 Other obstructive and reflux uropathy: Secondary | ICD-10-CM | POA: Diagnosis not present

## 2019-01-20 DIAGNOSIS — I5022 Chronic systolic (congestive) heart failure: Secondary | ICD-10-CM

## 2019-01-20 DIAGNOSIS — R7303 Prediabetes: Secondary | ICD-10-CM | POA: Diagnosis not present

## 2019-01-20 DIAGNOSIS — Z87891 Personal history of nicotine dependence: Secondary | ICD-10-CM

## 2019-01-20 DIAGNOSIS — N401 Enlarged prostate with lower urinary tract symptoms: Secondary | ICD-10-CM | POA: Diagnosis not present

## 2019-01-20 DIAGNOSIS — Z95810 Presence of automatic (implantable) cardiac defibrillator: Secondary | ICD-10-CM

## 2019-01-20 DIAGNOSIS — E785 Hyperlipidemia, unspecified: Secondary | ICD-10-CM | POA: Diagnosis not present

## 2019-01-20 DIAGNOSIS — Z125 Encounter for screening for malignant neoplasm of prostate: Secondary | ICD-10-CM

## 2019-01-20 DIAGNOSIS — I10 Essential (primary) hypertension: Secondary | ICD-10-CM

## 2019-01-20 DIAGNOSIS — I480 Paroxysmal atrial fibrillation: Secondary | ICD-10-CM

## 2019-01-20 DIAGNOSIS — H348391 Tributary (branch) retinal vein occlusion, unspecified eye, with retinal neovascularization: Secondary | ICD-10-CM

## 2019-01-20 LAB — COMPREHENSIVE METABOLIC PANEL
ALT: 13 U/L (ref 0–53)
AST: 18 U/L (ref 0–37)
Albumin: 4.1 g/dL (ref 3.5–5.2)
Alkaline Phosphatase: 105 U/L (ref 39–117)
BUN: 13 mg/dL (ref 6–23)
CO2: 28 mEq/L (ref 19–32)
Calcium: 9.2 mg/dL (ref 8.4–10.5)
Chloride: 105 mEq/L (ref 96–112)
Creatinine, Ser: 1.32 mg/dL (ref 0.40–1.50)
GFR: 53.73 mL/min — ABNORMAL LOW (ref >60.00)
Glucose, Bld: 86 mg/dL (ref 70–99)
Potassium: 3.3 mEq/L — ABNORMAL LOW (ref 3.5–5.1)
Sodium: 145 mEq/L (ref 135–145)
Total Bilirubin: 2.3 mg/dL — ABNORMAL HIGH (ref 0.2–1.2)
Total Protein: 6 g/dL (ref 6.0–8.3)

## 2019-01-20 LAB — CBC WITH DIFFERENTIAL/PLATELET
Basophils Absolute: 0 10*3/uL (ref 0.0–0.1)
Basophils Relative: 0.5 % (ref 0.0–3.0)
Eosinophils Absolute: 0.1 10*3/uL (ref 0.0–0.7)
Eosinophils Relative: 1.7 % (ref 0.0–5.0)
HCT: 51.5 % (ref 39.0–52.0)
Hemoglobin: 17.3 g/dL — ABNORMAL HIGH (ref 13.0–17.0)
Lymphocytes Relative: 9.7 % — ABNORMAL LOW (ref 12.0–46.0)
Lymphs Abs: 0.9 10*3/uL (ref 0.7–4.0)
MCHC: 33.5 g/dL (ref 30.0–36.0)
MCV: 88.7 fl (ref 78.0–100.0)
Monocytes Absolute: 0.6 10*3/uL (ref 0.1–1.0)
Monocytes Relative: 6.8 % (ref 3.0–12.0)
Neutro Abs: 7.2 10*3/uL (ref 1.4–7.7)
Neutrophils Relative %: 81.3 % — ABNORMAL HIGH (ref 43.0–77.0)
Platelets: 157 10*3/uL (ref 150.0–400.0)
RBC: 5.81 Mil/uL (ref 4.22–5.81)
RDW: 14.6 % (ref 11.5–15.5)
WBC: 8.8 10*3/uL (ref 4.0–10.5)

## 2019-01-20 LAB — HEMOGLOBIN A1C: Hgb A1c MFr Bld: 5.5 % (ref 4.6–6.5)

## 2019-01-20 LAB — POC URINALSYSI DIPSTICK (AUTOMATED)
Bilirubin, UA: NEGATIVE
Blood, UA: NEGATIVE
Glucose, UA: NEGATIVE
Ketones, UA: NEGATIVE
Leukocytes, UA: NEGATIVE
Nitrite, UA: NEGATIVE
Protein, UA: POSITIVE — AB
Spec Grav, UA: 1.02 (ref 1.010–1.025)
Urobilinogen, UA: 0.2 E.U./dL
pH, UA: 6 (ref 5.0–8.0)

## 2019-01-20 LAB — LIPID PANEL
Cholesterol: 126 mg/dL (ref 0–200)
HDL: 36.6 mg/dL — ABNORMAL LOW (ref >39.00)
NonHDL: 89
Total CHOL/HDL Ratio: 3
Triglycerides: 270 mg/dL — ABNORMAL HIGH (ref 0.0–149.0)
VLDL: 54 mg/dL — ABNORMAL HIGH (ref 0.0–40.0)

## 2019-01-20 LAB — PSA: PSA: 0.17 ng/mL (ref 0.10–4.00)

## 2019-01-20 LAB — LDL CHOLESTEROL, DIRECT: Direct LDL: 58 mg/dL

## 2019-01-20 MED ORDER — PANTOPRAZOLE SODIUM 40 MG PO TBEC: 40.0000 mg | DELAYED_RELEASE_TABLET | Freq: Two times a day (BID) | ORAL | 3 refills | Status: DC

## 2019-01-20 NOTE — Assessment & Plan Note (Signed)
Discussed lung cancer screening CT - pt eligible but declines at this time.

## 2019-01-20 NOTE — Progress Notes (Signed)
This visit was conducted in person.  BP 126/84 (BP Location: Left Arm, Patient Position: Sitting, Cuff Size: Large)   Pulse 66   Temp 99 F (37.2 C) (Temporal)   Ht 5' 7.5" (1.715 m)   Wt 281 lb 1 oz (127.5 kg)   SpO2 95%   BMI 43.37 kg/m    CC: AMW/CPE Subjective:    Patient ID: Patrick Garner, male    DOB: 1950/09/19, 68 y.o.   MRN: [id number]  HPI: Patrick Garner is a 68 y.o. male presenting on 01/20/2019 for Medicare Wellness (Pt accompanied by wife, Jana Half. )   Did not see health coach this year.  Lives in [place], drives 3 hours for appt.   Known PAF, sCHF, diffuse CAD with cardiomyopathy and pacemaker. H/o GI bleed s/p colonoscopy. On xarelto due to high stroke risk. Takes PPI BID. Sees cardiology [place] ([place]).  Requests labs sent to cardiology attn Karie Georges (ph [phone]) at [facility] in [place].  Had pacemaker check yesterday - showing atrial fibrillation - pt doesn't notice this.    Hearing Screening   _0  _1  _2  _3  _4  _5  _6  _7  _8   Right ear:   _9 40    Left ear:   _10 0      Visual Acuity Screening   Right eye Left eye Both eyes  Without correction: 20/100 20/50 20/50  With correction:     To see eye doctor next month.   Office Visit from 01/20/2019 in [place] at [facility] Total Score  0      Fall Risk  01/20/2019 08/10/2017 06/30/2016 11/14/2014 08/29/2013  Falls in the past year? 1 Yes No Yes No  Comment - fell after LOC - - -  Number falls in past yr: 1 1 - 2 or more -  Injury with Fall? 0 Yes - No -  Risk Factor Category  - - - High Fall Risk -  Risk for fall due to : - - - Impaired balance/gait;Impaired mobility -  Risk for fall due to: Comment - - - Uses cane -  Had fall last year walking into restaurant. Now using walker rolling regularly.   Preventative: Colon cancer screening - positive stool kit, states he had normal EGD and 10 polyps removed from  colonoscopy 12/2013 by Dr Christian Mate (in [place]).  Prostate cancer screening - pt does not want further prostate exams. Will continue PSA in h/o BPH on finasteride Lung cancer screening - 45 PY hx, eligible but declines.  Flu shot10/2017  Pneumovax - 05/2012.Prevnar6/2016. pneumovax 2019 Tetanus 2008.  Shingrix - discussed - will check with pharmacy about this.  Advanced directive scanned 12/2014. Wife Braulio Kiedrowski is HCPOA. No prolonged life support if terminal condition. No tube feeds. Seat belt use discussed - doesn't wear seat belt Sunscreen use discussed. No changing moles on skin. Ex smoker - quit 08/2013, averaged 1 ppd  Alcohol - none  Dentist - planning to schedule Eye exam - q6 mo Bowel - no constipation Bladder - some incontinence, some leaking over last 3-4 months worse at night  Caffeine: rare  Lives with wife, 1 cat  Live in [place], commutes for medical care. 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     Relevant past medical, surgical, family  and social history reviewed and updated as indicated. Interim medical history since our last visit reviewed. Allergies and medications reviewed and updated. Outpatient Medications Prior to Visit  Medication Sig Dispense Refill  . atorvastatin (LIPITOR) 80 MG tablet Take 80 mg by mouth daily.    . finasteride (PROSCAR) 5 MG tablet TAKE ONE TABLET BY MOUTH DAILY 90 tablet 1  . furosemide (LASIX) 40 MG tablet Take 1 tablet (40 mg total) by mouth daily.    Marland Kitchen gabapentin (NEURONTIN) 300 MG capsule TAKE ONE CAPSULE BY MOUTH TWICE A DAY 180 capsule 2  . Multiple Vitamin (MULTIVITAMIN) tablet Take 1 tablet by mouth daily.    . mupirocin ointment (BACTROBAN) 2 % Apply 1 application topically 2 (two) times daily. 22 g 0  . potassium chloride (K-DUR,KLOR-CON) 10 MEQ tablet Take 1 tablet  (10 mEq total) by mouth 2 (two) times daily. (Patient taking differently: Take 10 mEq by mouth daily. ) 180 tablet 1  . rivaroxaban (XARELTO) 20 MG TABS tablet Take 20 mg by mouth daily with supper.    . sertraline (ZOLOFT) 50 MG tablet Take 1 tablet (50 mg total) by mouth daily. 90 tablet 3  . sotalol (BETAPACE) 120 MG tablet Take 120 mg by mouth 2 (two) times daily. Takes 1/2 tablet 2 times daily    . lisinopril (PRINIVIL,ZESTRIL) 10 MG tablet Take 10 mg by mouth daily. Takes 1 (5 mg) tablet twice daily    . pantoprazole (PROTONIX) 40 MG tablet TAKE ONE TABLET BY MOUTH TWICE A DAY 180 tablet 0  . lisinopril (ZESTRIL) 5 MG tablet Take 1 tablet (5 mg total) by mouth 2 (two) times daily.     No facility-administered medications prior to visit.      Per HPI unless specifically indicated in ROS section below Review of Systems  Constitutional: Negative for activity change, appetite change, chills, fatigue, fever and unexpected weight change.  HENT: Negative for hearing loss.   Eyes: Negative for visual disturbance.  Respiratory: Negative for cough, chest tightness, shortness of breath and wheezing.   Cardiovascular: Negative for chest pain, palpitations and leg swelling.  Gastrointestinal: Positive for diarrhea (treated with imodium). Negative for abdominal distention, abdominal pain, blood in stool, constipation, nausea and vomiting.  Genitourinary: Negative for difficulty urinating and hematuria.  Musculoskeletal: Negative for arthralgias, myalgias and neck pain.  Skin: Negative for rash.  Neurological: Negative for dizziness, seizures, syncope and headaches.  Hematological: Negative for adenopathy. Does not bruise/bleed easily.  Psychiatric/Behavioral: Negative for dysphoric mood. The patient is not nervous/anxious.    Objective:    BP 126/84 (BP Location: Left Arm, Patient Position: Sitting, Cuff Size: Large)   Pulse 66   Temp 99 F (37.2 C) (Temporal)   Ht 5' 7.5" (1.715 m)   Wt 281  lb 1 oz (127.5 kg)   SpO2 95%   BMI 43.37 kg/m   Wt Readings from Last 3 Encounters:  01/20/19 281 lb 1 oz (127.5 kg)  02/04/18 278 lb 12 oz (126.4 kg)  08/10/17 272 lb 8 oz (123.6 kg)    Physical Exam Vitals signs and nursing note reviewed.  Constitutional:      General: He is not in acute distress.    Appearance: Normal appearance. He is well-developed. He is not ill-appearing.  HENT:     Head: Normocephalic and atraumatic.     Right Ear: Hearing, tympanic membrane, ear canal and external ear normal.     Left Ear: Hearing, tympanic membrane, ear canal and external  ear normal.     Nose: Nose normal.     Mouth/Throat:     Mouth: Mucous membranes are moist.     Pharynx: Uvula midline. No oropharyngeal exudate or posterior oropharyngeal erythema.  Eyes:     General: No scleral icterus.    Extraocular Movements: Extraocular movements intact.     Conjunctiva/sclera: Conjunctivae normal.     Pupils: Pupils are equal, round, and reactive to light.  Neck:     Musculoskeletal: Normal range of motion and neck supple.  Cardiovascular:     Rate and Rhythm: Normal rate. Rhythm irregularly irregular.     Pulses: Normal pulses.          Radial pulses are 2+ on the right side and 2+ on the left side.     Heart sounds: Normal heart sounds. No murmur.  Pulmonary:     Effort: Pulmonary effort is normal. No respiratory distress.     Breath sounds: Normal breath sounds. No wheezing, rhonchi or rales.  Abdominal:     General: Abdomen is protuberant. Bowel sounds are normal. There is no distension.     Palpations: Abdomen is soft. There is no mass.     Tenderness: There is no abdominal tenderness. There is no guarding or rebound.     Hernia: No hernia is present.  Musculoskeletal: Normal range of motion.     Right lower leg: No edema.     Left lower leg: No edema.  Lymphadenopathy:     Cervical: No cervical adenopathy.  Skin:    General: Skin is warm and dry.     Capillary Refill:  Capillary refill takes less than 2 seconds.     Findings: No rash.  Neurological:     General: No focal deficit present.     Mental Status: He is alert and oriented to person, place, and time.     Comments:  CN grossly intact, station and gait intact Recall 2/3, 2/3 with cue Calculation 3/5 [id number]  Psychiatric:        Mood and Affect: Mood normal.        Behavior: Behavior normal.        Thought Content: Thought content normal.        Judgment: Judgment normal.       Results for orders placed or performed in visit on 01/20/19  POCT Urinalysis Dipstick (Automated)  Result Value Ref Range   Color, UA dark yellow    Clarity, UA clear    Glucose, UA Negative Negative   Bilirubin, UA negative    Ketones, UA negative    Spec Grav, UA 1.020 1.010 - 1.025   Blood, UA negative    pH, UA 6.0 5.0 - 8.0   Protein, UA Positive (A) Negative   Urobilinogen, UA 0.2 0.2 or 1.0 E.U./dL   Nitrite, UA negative    Leukocytes, UA Negative Negative   Assessment & Plan:   Problem List Items Addressed This Visit    Severe obesity (BMI >= 40) ([place])    Encourage working on healthy diet for sustainable weight loss.       Prediabetes    Update A1c.       Relevant Orders   Hemoglobin A1c   Paroxysmal atrial fibrillation (HCC)    Sounds irregular today. On sotalol, xarelto. Planned cards f/u next week.       Relevant Medications   lisinopril (ZESTRIL) 5 MG tablet   Other Relevant Orders   CBC with Differential/Platelet  Mood disorder (HCC)    Stable period on sertraline.       Medicare annual wellness visit, subsequent - Primary    I have personally reviewed the Medicare Annual Wellness questionnaire and have noted 1. The patient's medical and social history 2. Their use of alcohol, tobacco or illicit drugs 3. Their current medications and supplements 4. The patient's functional ability including ADL's, fall risks, home safety risks and hearing or visual impairment. Cognitive  function has been assessed and addressed as indicated.  5. Diet and physical activity 6. Evidence for depression or mood disorders The patients weight, height, BMI have been recorded in the chart. I have made referrals, counseling and provided education to the patient based on review of the above and I have provided the pt with a written personalized care plan for preventive services. Provider list updated.. See scanned questionairre as needed for further documentation. Reviewed preventative protocols and updated unless pt declined.       HTN (hypertension)    Chronic, stable. Continue current regimen.       Relevant Medications   lisinopril (ZESTRIL) 5 MG tablet   HLD (hyperlipidemia)    Update FLP on high intensity statin. The 10-year ASCVD risk score Mikey Bussing DC Brooke Bonito., et al., 2013) is: 17.9%   Values used to calculate the score:     Age: 53 years     Sex: Male     Is Non-Hispanic African American: No     Diabetic: No     Tobacco smoker: No     Systolic Blood Pressure: 254 mmHg     Is BP treated: Yes     HDL Cholesterol: 35.3 mg/dL     Total Cholesterol: 131 mg/dL       Relevant Medications   lisinopril (ZESTRIL) 5 MG tablet   Other Relevant Orders   Lipid panel   Comprehensive metabolic panel   HFrEF (heart failure with reduced ejection fraction) (HCC)    Chronic, stable. Appreciate cardiology care.       Relevant Medications   lisinopril (ZESTRIL) 5 MG tablet   Health maintenance examination    Preventative protocols reviewed and updated unless pt declined. Discussed healthy diet and lifestyle.       Ex-smoker    Discussed lung cancer screening CT - pt eligible but declines at this time.       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       Relevant Medications   lisinopril (ZESTRIL) 5 MG tablet   Benign prostatic hyperplasia  with urinary obstruction    Update PSA. On finasteride.       Relevant Orders   PSA   POCT Urinalysis Dipstick (Automated) (Completed)   AICD (automatic cardioverter/defibrillator) present    Other Visit Diagnoses    Special screening for malignant neoplasm of prostate           Meds ordered this encounter  Medications  . pantoprazole (PROTONIX) 40 MG tablet    Sig: Take 1 tablet (40 mg total) by mouth 2 (two) times daily.    Dispense:  180 tablet    Refill:  3   Orders Placed This Encounter  Procedures  . Lipid panel  . Comprehensive metabolic panel  . Hemoglobin A1c  . PSA  . CBC with Differential/Platelet  . POCT Urinalysis Dipstick (Automated)    Follow up plan:  Return in about 1 year (around 01/20/2020) for annual exam, prior fasting for blood work, medicare wellness visit.  Ria Bush, MD

## 2019-01-20 NOTE — Assessment & Plan Note (Signed)
Preventative protocols reviewed and updated unless pt declined. Discussed healthy diet and lifestyle.  

## 2019-01-20 NOTE — Assessment & Plan Note (Addendum)
Update FLP on high intensity statin. The 10-year ASCVD risk score Mikey Bussing DC Brooke Bonito., et al., 2013) is: 17.9%   Values used to calculate the score:     Age: 68 years     Sex: Male     Is Non-Hispanic African American: No     Diabetic: No     Tobacco smoker: No     Systolic Blood Pressure: 098 mmHg     Is BP treated: Yes     HDL Cholesterol: 35.3 mg/dL     Total Cholesterol: 131 mg/dL

## 2019-01-20 NOTE — Assessment & Plan Note (Signed)
Stable period on sertraline.  

## 2019-01-20 NOTE — Assessment & Plan Note (Signed)
Encourage working on [employer] for sustainable weight loss.

## 2019-01-20 NOTE — Assessment & Plan Note (Signed)
With neovascularization, sees retinologist.

## 2019-01-20 NOTE — Assessment & Plan Note (Signed)
Update PSA. On finasteride.

## 2019-01-20 NOTE — Patient Instructions (Addendum)
Labs today We will forward results to cardiology.  Good to see you today, call us with questions.  Return in 1 year for next wellness visit.   Health Maintenance After Age 69 After age 66, you are at a higher risk for certain long-term diseases and infections as well as injuries from falls. Falls are a major cause of broken bones and head injuries in people who are older than age 23. Getting regular preventive care can help to keep you healthy and well. Preventive care includes getting regular testing and making lifestyle changes as recommended by your health care provider. Talk with your health care provider about:  Which screenings and tests you should have. A screening is a test that checks for a disease when you have no symptoms.  A diet and exercise plan that is right for you. What should I know about screenings and tests to prevent falls? Screening and testing are the best ways to find a health problem early. Early diagnosis and treatment give you the best chance of managing medical conditions that are common after age 58. Certain conditions and lifestyle choices may make you more likely to have a fall. Your health care provider may recommend:  Regular vision checks. Poor vision and conditions such as cataracts can make you more likely to have a fall. If you wear glasses, make sure to get your prescription updated if your vision changes.  Medicine review. Work with your health care provider to regularly review all of the medicines you are taking, including over-the-counter medicines. Ask your health care provider about any side effects that may make you more likely to have a fall. Tell your health care provider if any medicines that you take make you feel dizzy or sleepy.  Osteoporosis screening. Osteoporosis is a condition that causes the bones to get weaker. This can make the bones weak and cause them to break more easily.  Blood pressure screening. Blood pressure changes and medicines to  control blood pressure can make you feel dizzy.  Strength and balance checks. Your health care provider may recommend certain tests to check your strength and balance while standing, walking, or changing positions.  Foot health exam. Foot pain and numbness, as well as not wearing proper footwear, can make you more likely to have a fall.  Depression screening. You may be more likely to have a fall if you have a fear of falling, feel emotionally low, or feel unable to do activities that you used to do.  Alcohol use screening. Using too much alcohol can affect your balance and may make you more likely to have a fall. What actions can I take to lower my risk of falls? General instructions  Talk with your health care provider about your risks for falling. Tell your health care provider if: ? You fall. Be sure to tell your health care provider about all falls, even ones that seem minor. ? You feel dizzy, sleepy, or off-balance.  Take over-the-counter and prescription medicines only as told by your health care provider. These include any supplements.  Eat a healthy diet and maintain a healthy weight. A healthy diet includes low-fat dairy products, low-fat (lean) meats, and fiber from whole grains, beans, and lots of fruits and vegetables. Home safety  Remove any tripping hazards, such as rugs, cords, and clutter.  Install safety equipment such as grab bars in bathrooms and safety rails on stairs.  Keep rooms and walkways well-lit. Activity   Follow a regular exercise program to  stay fit. This will help you maintain your balance. Ask your health care provider what types of exercise are appropriate for you.  If you need a cane or walker, use it as recommended by your health care provider.  Wear supportive shoes that have nonskid soles. Lifestyle  Do not drink alcohol if your health care provider tells you not to drink.  If you drink alcohol, limit how much you have: ? 0-1 drink a day  for women. ? 0-2 drinks a day for men.  Be aware of how much alcohol is in your drink. In the [place] one drink equals one typical bottle of beer (12 oz), one-half glass of wine (5 oz), or one shot of hard liquor (1 oz).  Do not use any products that contain nicotine or tobacco, such as cigarettes and e-cigarettes. If you need help quitting, ask your health care provider. Summary  Having a healthy lifestyle and getting preventive care can help to protect your health and wellness after age 70.  Screening and testing are the best way to find a health problem early and help you avoid having a fall. Early diagnosis and treatment give you the best chance for managing medical conditions that are more common for people who are older than age 69.  Falls are a major cause of broken bones and head injuries in people who are older than age 28. Take precautions to prevent a fall at home.  Work with your health care provider to learn what changes you can make to improve your health and wellness and to prevent falls. This information is not intended to replace advice given to you by your health care provider. Make sure you discuss any questions you have with your health care provider. Document Released: 04/14/2017 Document Revised: 09/22/2018 Document Reviewed: 04/14/2017 Elsevier Patient Education  2020 [employer].

## 2019-01-20 NOTE — Assessment & Plan Note (Addendum)
Sounds irregular today. On sotalol, xarelto. Planned cards f/u next week.

## 2019-01-20 NOTE — Assessment & Plan Note (Signed)
Chronic, stable. Appreciate cardiology care.

## 2019-01-20 NOTE — Assessment & Plan Note (Signed)
Chronic, stable. Continue current regimen. 

## 2019-01-20 NOTE — Assessment & Plan Note (Signed)
Update A1c ?

## 2019-01-20 NOTE — Assessment & Plan Note (Signed)
Noted on UA today. Continue ACEI.

## 2019-01-20 NOTE — Assessment & Plan Note (Signed)

## 2019-01-22 ENCOUNTER — Other Ambulatory Visit: Payer: Self-pay | Admitting: Family Medicine

## 2019-01-22 MED ORDER — POTASSIUM CHLORIDE CRYS ER 20 MEQ PO TBCR: 20.0000 meq | EXTENDED_RELEASE_TABLET | Freq: Every day | ORAL | 3 refills | Status: DC

## 2019-03-06 LAB — HM DIABETES EYE EXAM

## 2019-04-03 ENCOUNTER — Encounter: Payer: Self-pay | Admitting: Family Medicine

## 2019-04-03 DIAGNOSIS — H269 Unspecified cataract: Secondary | ICD-10-CM | POA: Insufficient documentation

## 2019-04-14 IMAGING — DX DG HAND COMPLETE 3+V*L*
3 series · 3 of 3 positions shown · non-contrast
Comparison: No recent.

CLINICAL DATA: Pain left hand.

EXAM:
LEFT HAND - COMPLETE 3+ VIEW

[hand ap]
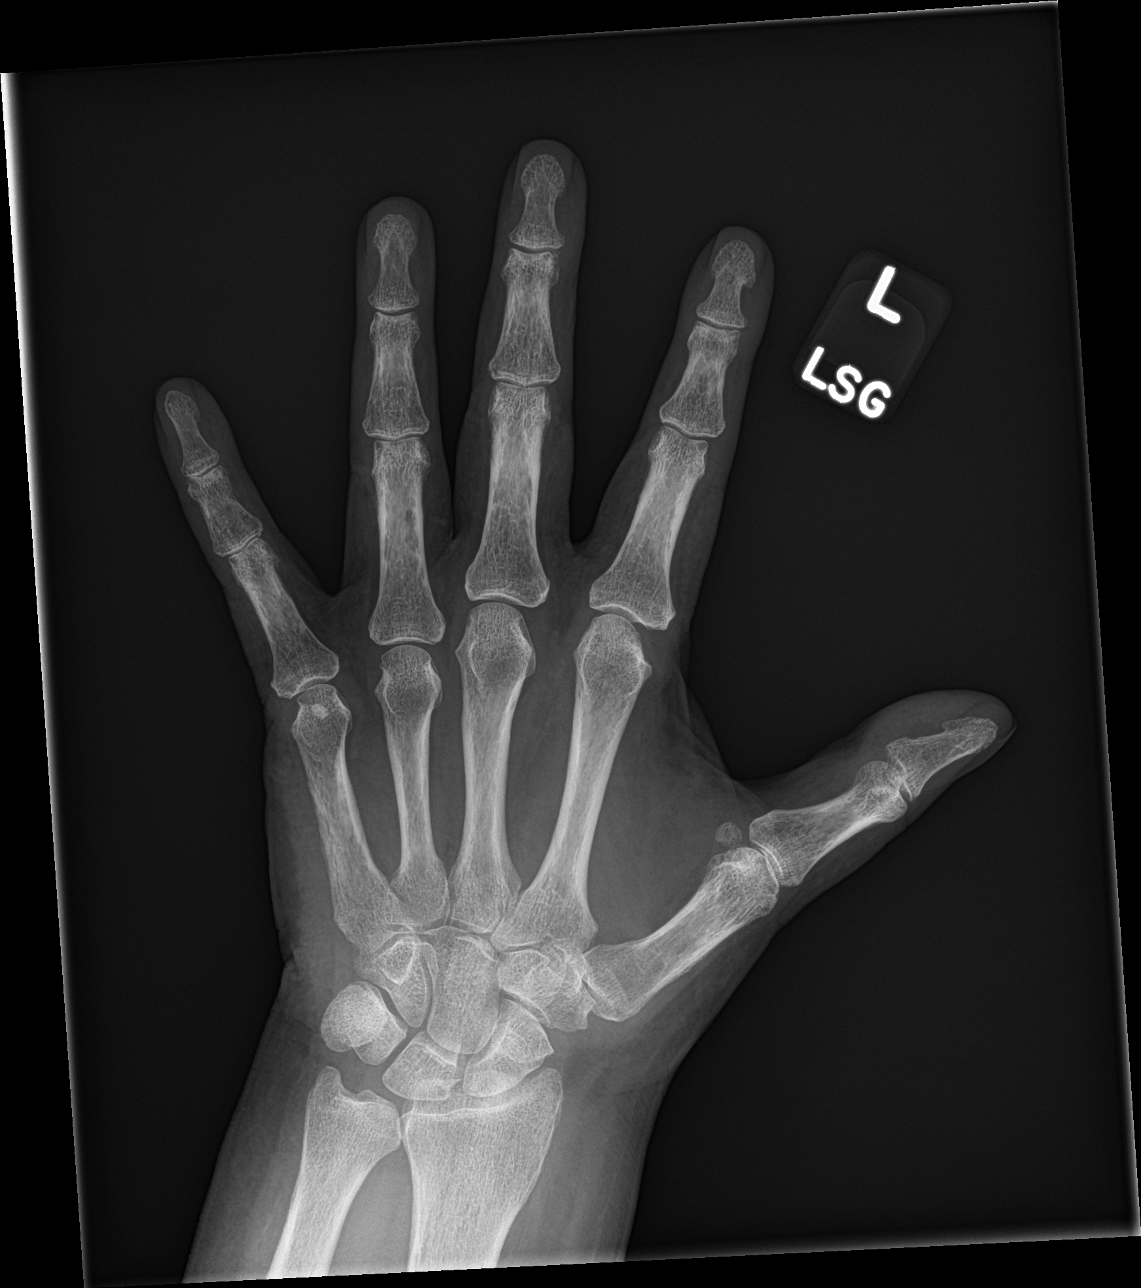

[hand obl]
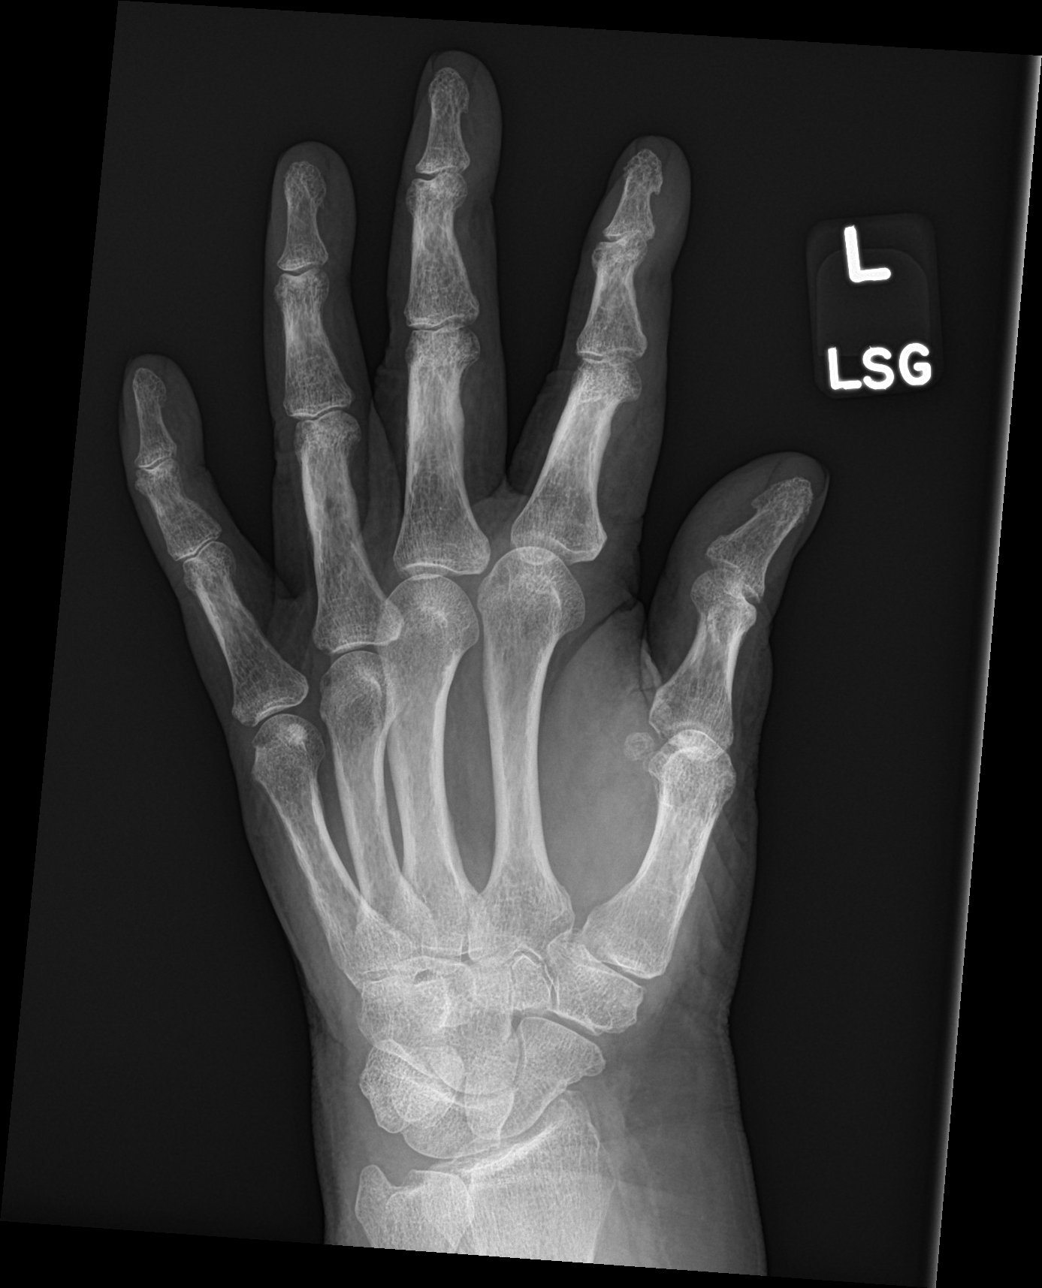

[hand lat]
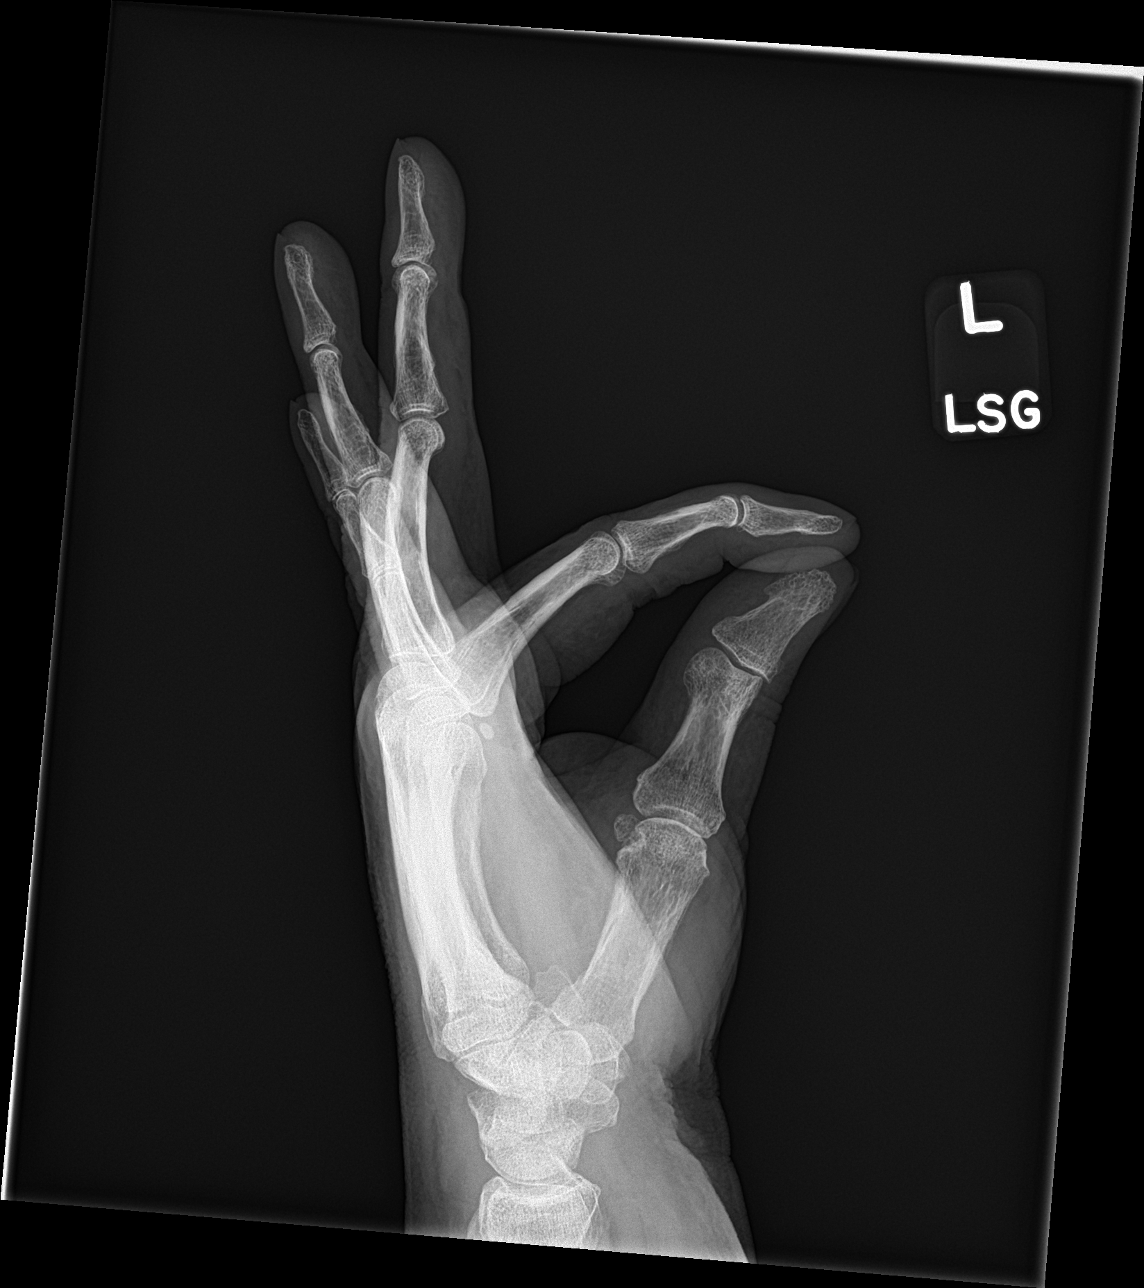

[3 of 3 positions shown; findings below may reference images not displayed]

FINDINGS: No acute bony or joint abnormalities identified. No evidence of
fracture or dislocation.
IMPRESSION: No acute abnormality.

## 2019-04-17 ENCOUNTER — Other Ambulatory Visit: Payer: Self-pay | Admitting: Family Medicine

## 2019-05-02 ENCOUNTER — Other Ambulatory Visit: Payer: Self-pay | Admitting: Family Medicine

## 2019-05-22 ENCOUNTER — Other Ambulatory Visit: Payer: Self-pay | Admitting: Family Medicine

## 2019-05-22 NOTE — Telephone Encounter (Signed)
Gabapentin Last filled:  02/24/19, #180 Last OV:  01/20/19, AWV Next OV:  01/26/20, AWV

## 2019-09-13 LAB — HM DIABETES EYE EXAM

## 2019-09-18 ENCOUNTER — Encounter: Payer: Self-pay | Admitting: Family Medicine

## 2019-09-18 DIAGNOSIS — H3342 Traction detachment of retina, left eye: Secondary | ICD-10-CM | POA: Insufficient documentation

## 2019-10-16 ENCOUNTER — Encounter: Payer: Self-pay | Admitting: Family Medicine

## 2019-10-16 MED ORDER — DICLOFENAC SODIUM 1 % EX GEL: 2.0000 g | Freq: Three times a day (TID) | CUTANEOUS | 1 refills | Status: DC

## 2019-11-20 ENCOUNTER — Other Ambulatory Visit: Payer: Self-pay | Admitting: Family Medicine

## 2019-11-20 NOTE — Telephone Encounter (Signed)
Last office visit 01/20/2019 for CPE.  Last refilled 05/23/2019 for #180 with 1 refill.  CPE scheduled for 01/26/2020.

## 2019-11-21 NOTE — Telephone Encounter (Signed)
ERx 

## 2020-01-19 ENCOUNTER — Other Ambulatory Visit: Payer: Self-pay | Admitting: Family Medicine

## 2020-01-22 NOTE — Telephone Encounter (Signed)
E-scribed refill.  Plz schedule wellness, cpe and lab visits.  

## 2020-01-26 ENCOUNTER — Encounter: Payer: Medicare PPO | Admitting: Family Medicine

## 2020-02-05 ENCOUNTER — Other Ambulatory Visit: Payer: Self-pay | Admitting: Family Medicine

## 2020-02-08 NOTE — Telephone Encounter (Signed)
E-scribed refill.  Pt has cpe on 04/26/20.  Plz schedule wellness and lab visits before 04/26/20.

## 2020-04-09 ENCOUNTER — Other Ambulatory Visit: Payer: Self-pay | Admitting: Family Medicine

## 2020-04-09 NOTE — Telephone Encounter (Signed)
Gabapentin Last filled:  02/22/20, #180 Last OV:  01/20/19, AWV Next OV:  04/26/20, AWV

## 2020-04-11 NOTE — Telephone Encounter (Signed)
ERx 

## 2020-04-26 ENCOUNTER — Other Ambulatory Visit: Payer: Self-pay

## 2020-04-26 ENCOUNTER — Ambulatory Visit (INDEPENDENT_AMBULATORY_CARE_PROVIDER_SITE_OTHER): Payer: Medicare PPO | Admitting: Family Medicine

## 2020-04-26 ENCOUNTER — Encounter: Payer: Self-pay | Admitting: Family Medicine

## 2020-04-26 VITALS — BP 134/82 | HR 62 | Temp 97.8°F | Ht 68.0 in | Wt 282.1 lb

## 2020-04-26 DIAGNOSIS — Z0000 Encounter for general adult medical examination without abnormal findings: Secondary | ICD-10-CM | POA: Diagnosis not present

## 2020-04-26 DIAGNOSIS — F39 Unspecified mood [affective] disorder: Secondary | ICD-10-CM

## 2020-04-26 DIAGNOSIS — N1831 Chronic kidney disease, stage 3a: Secondary | ICD-10-CM

## 2020-04-26 DIAGNOSIS — I480 Paroxysmal atrial fibrillation: Secondary | ICD-10-CM | POA: Diagnosis not present

## 2020-04-26 DIAGNOSIS — M545 Low back pain, unspecified: Secondary | ICD-10-CM

## 2020-04-26 DIAGNOSIS — N401 Enlarged prostate with lower urinary tract symptoms: Secondary | ICD-10-CM

## 2020-04-26 DIAGNOSIS — I5020 Unspecified systolic (congestive) heart failure: Secondary | ICD-10-CM

## 2020-04-26 DIAGNOSIS — H34832 Tributary (branch) retinal vein occlusion, left eye, with macular edema: Secondary | ICD-10-CM

## 2020-04-26 DIAGNOSIS — Z23 Encounter for immunization: Secondary | ICD-10-CM | POA: Diagnosis not present

## 2020-04-26 DIAGNOSIS — Z95810 Presence of automatic (implantable) cardiac defibrillator: Secondary | ICD-10-CM

## 2020-04-26 DIAGNOSIS — G4733 Obstructive sleep apnea (adult) (pediatric): Secondary | ICD-10-CM

## 2020-04-26 DIAGNOSIS — G8929 Other chronic pain: Secondary | ICD-10-CM

## 2020-04-26 DIAGNOSIS — I10 Essential (primary) hypertension: Secondary | ICD-10-CM

## 2020-04-26 DIAGNOSIS — E785 Hyperlipidemia, unspecified: Secondary | ICD-10-CM | POA: Diagnosis not present

## 2020-04-26 DIAGNOSIS — S37012D Minor contusion of left kidney, subsequent encounter: Secondary | ICD-10-CM

## 2020-04-26 DIAGNOSIS — Z8719 Personal history of other diseases of the digestive system: Secondary | ICD-10-CM

## 2020-04-26 DIAGNOSIS — N138 Other obstructive and reflux uropathy: Secondary | ICD-10-CM | POA: Diagnosis not present

## 2020-04-26 DIAGNOSIS — R7303 Prediabetes: Secondary | ICD-10-CM | POA: Diagnosis not present

## 2020-04-26 DIAGNOSIS — Z87891 Personal history of nicotine dependence: Secondary | ICD-10-CM

## 2020-04-26 DIAGNOSIS — Z950 Presence of cardiac pacemaker: Secondary | ICD-10-CM

## 2020-04-26 DIAGNOSIS — I272 Pulmonary hypertension, unspecified: Secondary | ICD-10-CM

## 2020-04-26 LAB — LIPID PANEL
Cholesterol: 142 mg/dL (ref 0–200)
HDL: 41.9 mg/dL (ref >39.00)
LDL Cholesterol: 62 mg/dL (ref 0–99)
NonHDL: 99.77
Total CHOL/HDL Ratio: 3
Triglycerides: 187 mg/dL — ABNORMAL HIGH (ref 0.0–149.0)
VLDL: 37.4 mg/dL (ref 0.0–40.0)

## 2020-04-26 LAB — COMPREHENSIVE METABOLIC PANEL
ALT: 13 U/L (ref 0–53)
AST: 19 U/L (ref 0–37)
Albumin: 4 g/dL (ref 3.5–5.2)
Alkaline Phosphatase: 101 U/L (ref 39–117)
BUN: 10 mg/dL (ref 6–23)
CO2: 27 mEq/L (ref 19–32)
Calcium: 8.6 mg/dL (ref 8.4–10.5)
Chloride: 106 mEq/L (ref 96–112)
Creatinine, Ser: 1.04 mg/dL (ref 0.40–1.50)
GFR: 72.73 mL/min (ref >60.00)
Glucose, Bld: 97 mg/dL (ref 70–99)
Potassium: 3.7 mEq/L (ref 3.5–5.1)
Sodium: 144 mEq/L (ref 135–145)
Total Bilirubin: 2.1 mg/dL — ABNORMAL HIGH (ref 0.2–1.2)
Total Protein: 5.9 g/dL — ABNORMAL LOW (ref 6.0–8.3)

## 2020-04-26 LAB — PSA: PSA: 0.14 ng/mL (ref 0.10–4.00)

## 2020-04-26 LAB — CBC WITH DIFFERENTIAL/PLATELET
Basophils Absolute: 0 10*3/uL (ref 0.0–0.1)
Basophils Relative: 0.4 % (ref 0.0–3.0)
Eosinophils Absolute: 0.4 10*3/uL (ref 0.0–0.7)
Eosinophils Relative: 4.9 % (ref 0.0–5.0)
HCT: 52.7 % — ABNORMAL HIGH (ref 39.0–52.0)
Hemoglobin: 17.9 g/dL — ABNORMAL HIGH (ref 13.0–17.0)
Lymphocytes Relative: 18.4 % (ref 12.0–46.0)
Lymphs Abs: 1.4 10*3/uL (ref 0.7–4.0)
MCHC: 34 g/dL (ref 30.0–36.0)
MCV: 86.3 fl (ref 78.0–100.0)
Monocytes Absolute: 0.4 10*3/uL (ref 0.1–1.0)
Monocytes Relative: 5.3 % (ref 3.0–12.0)
Neutro Abs: 5.4 10*3/uL (ref 1.4–7.7)
Neutrophils Relative %: 71 % (ref 43.0–77.0)
Platelets: 154 10*3/uL (ref 150.0–400.0)
RBC: 6.11 Mil/uL — ABNORMAL HIGH (ref 4.22–5.81)
RDW: 14.6 % (ref 11.5–15.5)
WBC: 7.6 10*3/uL (ref 4.0–10.5)

## 2020-04-26 LAB — HEMOGLOBIN A1C: Hgb A1c MFr Bld: 5.7 % (ref 4.6–6.5)

## 2020-04-26 LAB — VITAMIN D 25 HYDROXY (VIT D DEFICIENCY, FRACTURES): VITD: 32.48 ng/mL (ref 30.00–100.00)

## 2020-04-26 MED ORDER — DICLOFENAC SODIUM 1 % EX GEL
2.0000 g | Freq: Three times a day (TID) | CUTANEOUS | 3 refills | Status: DC
Start: 2020-04-26 — End: 2021-05-06

## 2020-04-26 MED ORDER — POTASSIUM CHLORIDE CRYS ER 20 MEQ PO TBCR
20.0000 meq | EXTENDED_RELEASE_TABLET | Freq: Every day | ORAL | 3 refills | Status: DC
Start: 2020-04-26 — End: 2021-05-21

## 2020-04-26 MED ORDER — SERTRALINE HCL 50 MG PO TABS
50.0000 mg | ORAL_TABLET | Freq: Every day | ORAL | 3 refills | Status: DC
Start: 2020-04-26 — End: 2021-05-14

## 2020-04-26 MED ORDER — FINASTERIDE 5 MG PO TABS
5.0000 mg | ORAL_TABLET | Freq: Every day | ORAL | 3 refills | Status: DC
Start: 2020-04-26 — End: 2021-06-10

## 2020-04-26 MED ORDER — GABAPENTIN 300 MG PO CAPS
300.0000 mg | ORAL_CAPSULE | Freq: Two times a day (BID) | ORAL | 3 refills | Status: DC
Start: 2020-04-26 — End: 2020-11-28

## 2020-04-26 NOTE — Assessment & Plan Note (Addendum)
Declines rpt colonoscopy, declines iFOB.

## 2020-04-26 NOTE — Assessment & Plan Note (Signed)
Stable period on sertraline 50mg  daily - wife feels this is helpful - will continue.

## 2020-04-26 NOTE — Assessment & Plan Note (Signed)
Chronic, update FLP on high intensity statin. The ASCVD Risk score Mikey Bussing DC Jr., et al., 2013) failed to calculate for the following reasons:   The valid total cholesterol range is 130 to 320 mg/dL

## 2020-04-26 NOTE — Assessment & Plan Note (Signed)
Encouraged healthy diet and lifestyle choices. 

## 2020-04-26 NOTE — Assessment & Plan Note (Signed)
Encouraged return to eye doctor as overdue.

## 2020-04-26 NOTE — Assessment & Plan Note (Signed)
Update Cr.  

## 2020-04-26 NOTE — Assessment & Plan Note (Signed)

## 2020-04-26 NOTE — Assessment & Plan Note (Signed)
Preventative protocols reviewed and updated unless pt declined. Discussed healthy diet and lifestyle.  

## 2020-04-26 NOTE — Assessment & Plan Note (Signed)
Continue yearly f/u with cardiology on lasix 40mg  daily.

## 2020-04-26 NOTE — Patient Instructions (Addendum)
Flu shot today  Labs today.  Schedule eye doctor appointment.  If interested, check with pharmacy about new 2 shot shingles series (shingrix).   Health Maintenance After Age 70 After age 21, you are at a higher risk for certain long-term diseases and infections as well as injuries from falls. Falls are a major cause of broken bones and head injuries in people who are older than age 43. Getting regular preventive care can help to keep you healthy and well. Preventive care includes getting regular testing and making lifestyle changes as recommended by your health care provider. Talk with your health care provider about:  Which screenings and tests you should have. A screening is a test that checks for a disease when you have no symptoms.  A diet and exercise plan that is right for you. What should I know about screenings and tests to prevent falls? Screening and testing are the best ways to find a health problem early. Early diagnosis and treatment give you the best chance of managing medical conditions that are common after age 44. Certain conditions and lifestyle choices may make you more likely to have a fall. Your health care provider may recommend:  Regular vision checks. Poor vision and conditions such as cataracts can make you more likely to have a fall. If you wear glasses, make sure to get your prescription updated if your vision changes.  Medicine review. Work with your health care provider to regularly review all of the medicines you are taking, including over-the-counter medicines. Ask your health care provider about any side effects that may make you more likely to have a fall. Tell your health care provider if any medicines that you take make you feel dizzy or sleepy.  Osteoporosis screening. Osteoporosis is a condition that causes the bones to get weaker. This can make the bones weak and cause them to break more easily.  Blood pressure screening. Blood pressure changes and medicines  to control blood pressure can make you feel dizzy.  Strength and balance checks. Your health care provider may recommend certain tests to check your strength and balance while standing, walking, or changing positions.  Foot health exam. Foot pain and numbness, as well as not wearing proper footwear, can make you more likely to have a fall.  Depression screening. You may be more likely to have a fall if you have a fear of falling, feel emotionally low, or feel unable to do activities that you used to do.  Alcohol use screening. Using too much alcohol can affect your balance and may make you more likely to have a fall. What actions can I take to lower my risk of falls? General instructions  Talk with your health care provider about your risks for falling. Tell your health care provider if: ? You fall. Be sure to tell your health care provider about all falls, even ones that seem minor. ? You feel dizzy, sleepy, or off-balance.  Take over-the-counter and prescription medicines only as told by your health care provider. These include any supplements.  Eat a healthy diet and maintain a healthy weight. A healthy diet includes low-fat dairy products, low-fat (lean) meats, and fiber from whole grains, beans, and lots of fruits and vegetables. Home safety  Remove any tripping hazards, such as rugs, cords, and clutter.  Install safety equipment such as grab bars in bathrooms and safety rails on stairs.  Keep rooms and walkways well-lit. Activity   Follow a regular exercise program to stay fit. This  will help you maintain your balance. Ask your health care provider what types of exercise are appropriate for you.  If you need a cane or walker, use it as recommended by your health care provider.  Wear supportive shoes that have nonskid soles. Lifestyle  Do not drink alcohol if your health care provider tells you not to drink.  If you drink alcohol, limit how much you have: ? 0-1 drink a day  for women. ? 0-2 drinks a day for men.  Be aware of how much alcohol is in your drink. In the [place] one drink equals one typical bottle of beer (12 oz), one-half glass of wine (5 oz), or one shot of hard liquor (1 oz).  Do not use any products that contain nicotine or tobacco, such as cigarettes and e-cigarettes. If you need help quitting, ask your health care provider. Summary  Having a healthy lifestyle and getting preventive care can help to protect your health and wellness after age 16.  Screening and testing are the best way to find a health problem early and help you avoid having a fall. Early diagnosis and treatment give you the best chance for managing medical conditions that are more common for people who are older than age 67.  Falls are a major cause of broken bones and head injuries in people who are older than age 69. Take precautions to prevent a fall at home.  Work with your health care provider to learn what changes you can make to improve your health and wellness and to prevent falls. This information is not intended to replace advice given to you by your health care provider. Make sure you discuss any questions you have with your health care provider. Document Revised: 09/22/2018 Document Reviewed: 04/14/2017 Elsevier Patient Education  2020 [employer].

## 2020-04-26 NOTE — Assessment & Plan Note (Signed)
Managed with gabapentin 300mg  bid.

## 2020-04-26 NOTE — Assessment & Plan Note (Signed)
Chronic, stable. Continue current regimen. 

## 2020-04-26 NOTE — Assessment & Plan Note (Addendum)
Check PSA on finasteride.  Denies significant BPH symptoms.

## 2020-04-26 NOTE — Assessment & Plan Note (Signed)
Declines lung cancer screening.

## 2020-04-26 NOTE — Progress Notes (Signed)
Patient ID: Patrick Garner, male    DOB: 11/06/1950, 69 y.o.   MRN: [id number]  This visit was conducted in person.  BP 134/82 (BP Location: Left Arm, Patient Position: Sitting, Cuff Size: Large)   Pulse 62   Temp 97.8 F (36.6 C) (Temporal)   Ht 5' 8"  (1.727 m) Comment: Per pt.  Declined to measure in office.  Wt 282 lb 1 oz (127.9 kg)   SpO2 97%   BMI 42.89 kg/m    CC: AMW/CPE Subjective:   HPI: Patrick Garner is a 69 y.o. male presenting on 04/26/2020 for Medicare Wellness (Pt accompanied by wife, Rosann Auerbach- temp 97.6.)   They drove here this morning from [facility] for 7:30am appointment, woke up at 2am.  Using walker since 02/2017.  Did not see health advisor this year.  No memory concerns noted by patient or wife.    Hearing Screening   125Hz  250Hz  500Hz  1000Hz  2000Hz  3000Hz  4000Hz  6000Hz  8000Hz   Right ear:   20 20 20   40    Left ear:   20 20 20  25       Visual Acuity Screening   Right eye Left eye Both eyes  Without correction:     With correction: 20/40 20/40 20/40       Office Visit from 04/26/2020 in [place] at [facility]  PHQ-2 Total Score 1      Fall Risk  04/26/2020 01/20/2019 08/10/2017 06/30/2016 11/14/2014  Falls in the past year? 0 1 Yes No Yes  Comment - - fell after LOC - -  Number falls in past yr: - 1 1 - 2 or more  Injury with Fall? - 0 Yes - No  Risk Factor Category  - - - - High Fall Risk  Risk for fall due to : - - - - Impaired balance/gait;Impaired mobility  Risk for fall due to: Comment - - - - Uses cane  Uses rolling walker regularly.   Known PAF, sCHF, diffuse CAD with cardiomyopathy and pacemaker. H/o GI bleed s/p colonoscopy. On xarelto due to high stroke risk. Takes PPI BID. Sees cardiologist in [facility] ([place] Sees every summer.   Requests labs sent to cardiology attn Karie Georges (ph [phone]) at [facility] in [place].  Preventative: Colon cancer screening - positive stool kit, states he had normal EGD  and 10 polyps removed from colonoscopy 12/2013 by Dr Christian Mate (in [place]). Declines repeat, declines iFOB. No blood in stool.  Prostate cancer screening - pt does not want further prostate exams. Will continue PSA in h/o BPH on finasteride  Lung cancer screening - 45 PY hx, eligible but declines.  Flu shot10/2017  COVID vaccine J&J 08/2019  Pneumovax - 05/2012.Prevnar6/2016.pneumovax 2019 Tetanus 2008. Shingrix - discussed - will check with pharmacy about this.  Advanced directive scanned 12/2014. Wife Natthew Marlatt is HCPOA. No prolonged life support if terminal condition. No tube feeds. Seat belt use discussed - doesn't wear seat belt, delcines Sunscreen use discussed. No changing moles on skin. Ex smoker - quit 08/2013, averaged 1 ppd  Alcohol - none Dentist - planning to schedule  Eye exam - over a year ago Bowel - no constipation - ongoing diarrhea since colonoscopy. Takes dulcolax 3x/wk. Takes imodium about twice a month. Discussed this.  Bladder - some incontinence, some leaking over last 3-4 months worse at night   Caffeine: rare  Lives with wife, 1 cat  Live in [place], commutes for medical care.  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     Relevant past medical, surgical, family and social history reviewed and updated as indicated. Interim medical history since our last visit reviewed. Allergies and medications reviewed and updated. Outpatient Medications Prior to Visit  Medication Sig Dispense Refill  . atorvastatin (LIPITOR) 80 MG tablet Take 80 mg by mouth daily.    . furosemide (LASIX) 40 MG tablet Take 1 tablet (40 mg total) by mouth daily.    Marland Kitchen lisinopril (ZESTRIL) 20 MG tablet Take 1 tablet (20 mg total) by mouth daily.    . Multiple Vitamin (MULTIVITAMIN) tablet Take 1 tablet by mouth daily.    . mupirocin  ointment (BACTROBAN) 2 % Apply 1 application topically 2 (two) times daily. 22 g 0  . pantoprazole (PROTONIX) 40 MG tablet Take 1 tablet (40 mg total) by mouth daily.    . rivaroxaban (XARELTO) 20 MG TABS tablet Take 20 mg by mouth daily with supper.    . sotalol (BETAPACE) 120 MG tablet Take 120 mg by mouth 2 (two) times daily. Takes 1/2 tablet 2 times daily    . diclofenac Sodium (VOLTAREN) 1 % GEL Apply 2 g topically 3 (three) times daily. 100 g 1  . finasteride (PROSCAR) 5 MG tablet TAKE ONE TABLET BY MOUTH DAILY 90 tablet 0  . gabapentin (NEURONTIN) 300 MG capsule TAKE ONE CAPSULE BY MOUTH TWICE A DAY 180 capsule 1  . KLOR-CON M20 20 MEQ tablet TAKE ONE TABLET BY MOUTH DAILY **GENERIC K-DUR** 90 tablet 0  . lisinopril (ZESTRIL) 10 MG tablet Take 1 tablet (10 mg total) by mouth in the morning and at bedtime.    Marland Kitchen lisinopril (ZESTRIL) 5 MG tablet Take 5 mg by mouth 2 (two) times daily. Now takes 2 tablets twice a day (20 mg total)    . pantoprazole (PROTONIX) 40 MG tablet TAKE ONE TABLET BY MOUTH TWICE A DAY (Patient taking differently: Takes once daily) 180 tablet 0  . sertraline (ZOLOFT) 50 MG tablet TAKE ONE TABLET BY MOUTH DAILY 90 tablet 0   No facility-administered medications prior to visit.     Per HPI unless specifically indicated in ROS section below Review of Systems  Constitutional: Negative for activity change, appetite change, chills, fatigue, fever and unexpected weight change.  HENT: Negative for hearing loss.   Eyes: Negative for visual disturbance.  Respiratory: Negative for cough, chest tightness, shortness of breath and wheezing.   Cardiovascular: Negative for chest pain, palpitations and leg swelling.  Gastrointestinal: Positive for diarrhea. Negative for abdominal distention, abdominal pain, blood in stool, constipation, nausea and vomiting.  Genitourinary: Negative for difficulty urinating and hematuria.  Musculoskeletal: Negative for arthralgias, myalgias and neck  pain.  Skin: Negative for rash.  Neurological: Negative for dizziness, seizures, syncope and headaches.  Hematological: Negative for adenopathy. Does not bruise/bleed easily.  Psychiatric/Behavioral: Negative for dysphoric mood. The patient is not nervous/anxious.    Objective:  BP 134/82 (BP Location: Left Arm, Patient Position: Sitting, Cuff Size: Large)   Pulse 62   Temp 97.8 F (36.6 C) (Temporal)   Ht 5' 8"  (1.727 m) Comment: Per pt.  Declined to measure in office.  Wt 282 lb 1 oz (127.9 kg)   SpO2 97%   BMI 42.89 kg/m   Wt Readings from Last 3 Encounters:  04/26/20 282 lb 1 oz (127.9 kg)  01/20/19 281 lb 1 oz (127.5 kg)  02/04/18 278 lb 12 oz (126.4 kg)      Physical Exam Vitals and nursing note reviewed.  Constitutional:      General: He is not in acute distress.    Appearance: Normal appearance. He is well-developed. He is obese. He is not ill-appearing.  HENT:     Head: Normocephalic and atraumatic.     Right Ear: Hearing, tympanic membrane, ear canal and external ear normal.     Left Ear: Hearing, tympanic membrane, ear canal and external ear normal.     Nose: Nose normal.  Eyes:     General: No scleral icterus.    Conjunctiva/sclera: Conjunctivae normal.     Pupils: Pupils are equal, round, and reactive to light.  Neck:     Thyroid: No thyroid mass or thyromegaly.     Vascular: No carotid bruit.  Cardiovascular:     Rate and Rhythm: Normal rate and regular rhythm.     Pulses: Normal pulses.          Radial pulses are 2+ on the right side and 2+ on the left side.     Heart sounds: Normal heart sounds. No murmur heard.   Pulmonary:     Effort: Pulmonary effort is normal. No respiratory distress.     Breath sounds: Normal breath sounds. No wheezing, rhonchi or rales.  Abdominal:     General: Abdomen is protuberant. Bowel sounds are normal. There is no distension.     Palpations: Abdomen is soft. There is no mass.     Tenderness: There is no abdominal  tenderness. There is no guarding or rebound.     Hernia: No hernia is present.  Musculoskeletal:        General: Normal range of motion.     Cervical back: Normal range of motion and neck supple.  Lymphadenopathy:     Cervical: No cervical adenopathy.  Skin:    General: Skin is warm and dry.     Findings: No rash.  Neurological:     General: No focal deficit present.     Mental Status: He is alert and oriented to person, place, and time.     Comments:  CN grossly intact, station and gait intact Recall 1/3, 2/3 with cue Calculation - declines   Psychiatric:        Mood and Affect: Mood normal.        Behavior: Behavior normal.        Thought Content: Thought content normal.        Judgment: Judgment normal.       Results for orders placed or performed in visit on 09/13/19  HM DIABETES EYE EXAM  Result Value Ref Range   HM Diabetic Eye Exam     Assessment & Plan:  This visit occurred during the SARS-CoV-2 public health emergency.  Safety protocols were in place, including screening questions prior to the visit, additional usage of staff PPE, and extensive cleaning of exam room while observing appropriate contact time as indicated for disinfecting solutions.   Problem List Items Addressed This Visit    Severe obesity (BMI >= 40) (HCC)    Encouraged healthy diet and lifestyle choices.       Renal hematoma, left, subsequent encounter    Did not f/u with uro, declined rpt imaging. Check CBC and Cr today.       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 Declines CPAP  Relevant Medications   lisinopril (ZESTRIL) 20 MG tablet   Prediabetes    Update A1c.       Relevant Orders   Hemoglobin A1c   Paroxysmal atrial fibrillation ([place])    Sounds regular today.       Relevant Medications   lisinopril (ZESTRIL) 20 MG tablet   Other Relevant Orders   CBC with Differential/Platelet   Pacemaker   OSA (obstructive sleep apnea)    Declines CPAP or further sleep eval.        Mood disorder (HCC)    Stable period on sertraline 49m daily - wife feels this is helpful - will continue.       Medicare annual wellness visit, subsequent - Primary    I have personally reviewed the Medicare Annual Wellness questionnaire and have noted 1. The patient's medical and social history 2. Their use of alcohol, tobacco or illicit drugs 3. Their current medications and supplements 4. The patient's functional ability including ADL's, fall risks, home safety risks and hearing or visual impairment. Cognitive function has been assessed and addressed as indicated.  5. Diet and physical activity 6. Evidence for depression or mood disorders The patients weight, height, BMI have been recorded in the chart. I have made referrals, counseling and provided education to the patient based on review of the above and I have provided the pt with a written personalized care plan for preventive services. Provider list updated.. See scanned questionairre as needed for further documentation. Reviewed preventative protocols and updated unless pt declined.       HTN (hypertension)    Chronic, stable. Continue current regimen.       Relevant Medications   lisinopril (ZESTRIL) 20 MG tablet   HLD (hyperlipidemia)    Chronic, update FLP on high intensity statin. The ASCVD Risk score (Mikey BussingDC Jr., et al., 2013) failed to calculate for the following reasons:   The valid total cholesterol range is 130 to 320 mg/dL       Relevant Medications   lisinopril (ZESTRIL) 20 MG tablet   Other Relevant Orders   Lipid panel   Comprehensive metabolic panel   History of GI bleed    Declines rpt colonoscopy, declines iFOB.       HFrEF (heart failure with reduced ejection fraction) ([place]    Continue yearly f/u with cardiology on lasix 489mdaily.       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       Relevant Medications   lisinopril (ZESTRIL) 20 MG tablet   Health maintenance examination    Preventative protocols reviewed and updated unless pt declined. Discussed healthy diet and lifestyle.       Ex-smoker    Declines lung cancer screening.       Chronic kidney disease, stage 3a ([place]   Update Cr.       Relevant Orders   VITAMIN D 25 Hydroxy (Vit-D Deficiency, Fractures)   Chronic back pain    Managed with gabapentin 30050mid.       Relevant Medications   gabapentin (NEURONTIN) 300 MG capsule   sertraline (ZOLOFT) 50 MG tablet   Benign prostatic hyperplasia with urinary obstruction    Check PSA on finasteride.  Denies significant BPH symptoms.       Relevant Medications   finasteride (PROSCAR) 5 MG tablet  Other Relevant Orders   PSA   AICD (automatic cardioverter/defibrillator) present    Other Visit Diagnoses    Need for influenza vaccination       Relevant Orders   Flu Vaccine QUAD High Dose(Fluad) (Completed)       Meds ordered this encounter  Medications  . finasteride (PROSCAR) 5 MG tablet    Sig: Take 1 tablet (5 mg total) by mouth daily.    Dispense:  90 tablet    Refill:  3  . gabapentin (NEURONTIN) 300 MG capsule    Sig: Take 1 capsule (300 mg total) by mouth 2 (two) times daily.    Dispense:  180 capsule    Refill:  3  . potassium chloride SA (KLOR-CON M20) 20 MEQ tablet    Sig: Take 1 tablet (20 mEq total) by mouth daily.    Dispense:  90 tablet    Refill:  3  . sertraline (ZOLOFT) 50 MG tablet    Sig: Take 1 tablet (50 mg total) by mouth daily.    Dispense:  90 tablet    Refill:  3  . diclofenac Sodium (VOLTAREN) 1 % GEL    Sig: Apply 2 g topically 3 (three) times daily.    Dispense:  100 g    Refill:  3   Orders Placed This Encounter  Procedures  . Flu Vaccine QUAD High Dose(Fluad)  . Lipid panel  . Comprehensive metabolic panel  . Hemoglobin A1c  . PSA  . CBC with  Differential/Platelet  . VITAMIN D 25 Hydroxy (Vit-D Deficiency, Fractures)    Patient instructions: Flu shot today  Labs today.  Schedule eye doctor appointment.  If interested, check with pharmacy about new 2 shot shingles series (shingrix).   Follow up plan: Return in about 1 year (around 04/26/2021), or if symptoms worsen or fail to improve, for annual exam, prior fasting for blood work, medicare wellness visit.  Ria Bush, MD

## 2020-04-26 NOTE — Assessment & Plan Note (Signed)
Likely OSA related. Declines CPAP

## 2020-04-26 NOTE — Assessment & Plan Note (Signed)
Sounds regular today.  ?

## 2020-04-26 NOTE — Assessment & Plan Note (Signed)
Declines CPAP or further sleep eval.

## 2020-04-26 NOTE — Assessment & Plan Note (Addendum)
Did not f/u with uro, declined rpt imaging. Check CBC and Cr today.

## 2020-04-26 NOTE — Assessment & Plan Note (Signed)
Update A1c ?

## 2020-04-29 ENCOUNTER — Encounter: Payer: Self-pay | Admitting: Family Medicine

## 2020-04-29 DIAGNOSIS — D751 Secondary polycythemia: Secondary | ICD-10-CM | POA: Insufficient documentation

## 2020-10-14 ENCOUNTER — Other Ambulatory Visit: Payer: Self-pay | Admitting: Family Medicine

## 2020-10-15 NOTE — Telephone Encounter (Signed)
Refill request Pantoprazole Refill request shows twice a day, med list shows once a day See lab results 04/26/20, no upcoming lab appointment scheduled Letter mailed to patient

## 2020-10-16 NOTE — Telephone Encounter (Signed)
ERx 

## 2020-11-27 ENCOUNTER — Other Ambulatory Visit: Payer: Self-pay | Admitting: Family Medicine

## 2020-11-28 ENCOUNTER — Telehealth: Payer: Self-pay

## 2020-11-28 NOTE — Telephone Encounter (Signed)
Looks like pt was only scheduled for CPE on 06/10/21.  Plz schedule wellness and lab visit before CPE date.

## 2020-11-28 NOTE — Telephone Encounter (Signed)
ERx 

## 2020-11-28 NOTE — Telephone Encounter (Signed)
Noted  

## 2020-11-28 NOTE — Telephone Encounter (Signed)
Patient requested same day labs of physical. I did however schedule mcwv . Thanks, EM

## 2020-11-28 NOTE — Telephone Encounter (Signed)
Gabapentin Last filled:  08/28/20, #180 Last OV:  04/26/20, AWV Next OV:  06/10/21, AWV prt 2

## 2021-05-05 ENCOUNTER — Other Ambulatory Visit: Payer: Self-pay | Admitting: Family Medicine

## 2021-05-05 NOTE — Telephone Encounter (Signed)
Voltaren gel Last filled:  01/22/21, #100 g Last OV:  04/26/20, AWV Next OV:  06/10/21, AWV

## 2021-05-14 ENCOUNTER — Other Ambulatory Visit: Payer: Self-pay | Admitting: Family Medicine

## 2021-05-20 NOTE — Progress Notes (Signed)
Subjective:   Patrick Garner is a 70 y.o. male who presents for Medicare Annual/Subsequent preventive examination.  I connected with [name] today by telephone and verified that I am speaking with the correct person using two identifiers. Location patient: home Location provider: work Persons participating in the virtual visit: patient, [name].    I discussed the limitations, risks, security and privacy concerns of performing an evaluation and management service by telephone and the availability of in person appointments. I also discussed with the patient that there may be a patient responsible charge related to this service. The patient expressed understanding and verbally consented to this telephonic visit.    Interactive audio and video telecommunications were attempted between this provider and patient, however failed, due to patient having technical difficulties OR patient did not have access to video capability.  We continued and completed visit with audio only.  Some vital signs may be absent or patient reported.   Time Spent with patient on telephone encounter: 25 minutes  Review of Systems     Cardiac Risk Factors include: advanced age (>63men, >65 women);hypertension;dyslipidemia     Objective:    Today's Vitals   05/23/21 1527  Weight: 282 lb (127.9 kg)  Height: 5\' 8"  (1.727 m)   Body mass index is 42.88 kg/m.  Advanced Directives 05/23/2021 08/10/2017 06/30/2016  Does Patient Have a Medical Advance Directive? Yes Yes Yes  Type of Advance Directive Living will [place];Living will [place];Living will  Does patient want to make changes to medical advance directive? Yes (MAU/Ambulatory/Procedural Areas - Information given) - -  Copy of [place] in Chart? - Yes Yes    Current Medications (verified) Outpatient Encounter Medications as of 05/23/2021  Medication Sig   atorvastatin (LIPITOR) 80 MG tablet  Take 80 mg by mouth daily.   diclofenac Sodium (VOLTAREN) 1 % GEL APPLY 2 GRAMS TOPICALLY THREE TIMES A DAY   finasteride (PROSCAR) 5 MG tablet Take 1 tablet (5 mg total) by mouth daily.   furosemide (LASIX) 40 MG tablet Take 1 tablet (40 mg total) by mouth daily.   gabapentin (NEURONTIN) 300 MG capsule TAKE ONE CAPSULE BY MOUTH TWICE A DAY DAILY.   KLOR-CON M20 20 MEQ tablet TAKE ONE TABLET BY MOUTH DAILY   lisinopril (ZESTRIL) 20 MG tablet Take 1 tablet (20 mg total) by mouth daily.   Multiple Vitamin (MULTIVITAMIN) tablet Take 1 tablet by mouth daily.   mupirocin ointment (BACTROBAN) 2 % Apply 1 application topically 2 (two) times daily.   pantoprazole (PROTONIX) 40 MG tablet TAKE ONE TABLET BY MOUTH TWICE A DAY   rivaroxaban (XARELTO) 20 MG TABS tablet Take 20 mg by mouth daily with supper.   sertraline (ZOLOFT) 50 MG tablet TAKE ONE TABLET BY MOUTH DAILY TAKE AS DIRECTED   sotalol (BETAPACE) 120 MG tablet Take 120 mg by mouth 2 (two) times daily. Takes 1/2 tablet 2 times daily   [DISCONTINUED] potassium chloride SA (KLOR-CON M20) 20 MEQ tablet Take 1 tablet (20 mEq total) by mouth daily.   No facility-administered encounter medications on file as of 05/23/2021.    Allergies (verified) Patient has no known allergies.   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   Dr  Pilar Plate in [place] ([place] then monitor)   CAD (coronary artery disease) 2002, 2015   nonocclusive by cath 2015 - remote MI, inferior wall MI, rec medical management   Chronic back pain    h/o spinal fusion with rods/screw, on morphine/gabapentin   Chronic narcotic use    tapered off   GI bleed 12/2013   hospitalization s/p unrevealing EGD/colonoscopy   History of Bell's palsy    HLD (hyperlipidemia)     HTN (hypertension)    Insomnia    OSA (obstructive sleep apnea)    refuses sleep eval   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   Paroxysmal atrial fibrillation ([place]) 10/2013   new onset   Peripheral neuropathy    PUD (peptic ulcer disease) 1980s   via EGD   Pulmonary HTN ([place]) 10/2013   RBBB 2002   RLS (restless legs syndrome)    Smoker    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   EF 27% on 2d lexisccan cardiolite stress test ([facility])   Past Surgical History:  Procedure Laterality Date   BACK SURGERY  2002   plate placed   CARDIAC CATHETERIZATION  10/2013   diffuse CAD, no sites for PCI, rec against CABG   CARDIOVASCULAR STRESS TEST  2002   Adenosine Cardiolite with prior small inferior infarct, EF 59%   CARDIOVASCULAR STRESS TEST  10/2013   lexiscan cardiolite - mod sized nontransmural infarct in inferior wall with small mild intensity peri-infarct ischemia, likely pulm HTN, akinesis of inferior wall with marked hypokinesis of LV and EF 27%   HERNIA REPAIR  2005   KNEE SURGERY  2008   PACEMAKER INSERTION  01/04/2014   AICD dualchamber PPM/ICD [employer] UJ8119-14N, serial #[id number]   TONSILLECTOMY  1950's   US ECHOCARDIOGRAPHY  12/2013   at [place] - EF 25-35% with mod global hypokinesis   Family History  Problem Relation Age of Onset   Heart disease Father        CHF   Cancer Father        unsure type   Diabetes Paternal Aunt    Diabetes Paternal Uncle    Stroke Neg Hx    CAD Neg Hx    Social History   Socioeconomic History   Marital status: Married    Spouse name: Not on file   Number of children: Not on file   Years of education: Not on file   Highest education level: Not on file  Occupational History   Not on file  Tobacco Use   Smoking status: Former    Packs/day: 0.80    Types: Cigarettes    Quit date: 08/13/2013    Years since quitting: 7.7   Smokeless tobacco: Never  Vaping Use   Vaping Use: Never used  Substance and Sexual Activity   Alcohol use: No     Alcohol/week: 0.0 standard drinks   Drug use: No   Sexual activity: Never  Other Topics Concern   Not on file  Social History Narrative   Caffeine: rare   Lives with wife, 1 cat   Live in [place], commutes for medical care.  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, was truck driver      Cardiology: Dr Megan Salon, Dr Meda Klinefelter [place], [place] [id number], fax [phone]   Social Determinants of Health   Financial Resource Strain: Low Risk    Difficulty of Paying Living  Expenses: Not hard at all  Food Insecurity: No Food Insecurity   Worried About Charity fundraiser in the Last Year: Never true   Ran Out of Food in the Last Year: Never true  Transportation Needs: No Transportation Needs   Lack of Transportation (Medical): No   Lack of Transportation (Non-Medical): No  Physical Activity: Inactive   Days of Exercise per Week: 0 days   Minutes of Exercise per Session: 0 min  Stress: No Stress Concern Present   Feeling of Stress : Not at all  Social Connections: Moderately Isolated   Frequency of Communication with Friends and Family: Once a week   Frequency of Social Gatherings with Friends and Family: Twice a week   Attends Religious Services: Never   [name] or Organizations: No   Attends Music therapist: Never   Marital Status: Married    Tobacco Counseling Counseling given: Not Answered   Clinical Intake:  Pre-visit preparation completed: Yes  Pain : No/denies pain     BMI - recorded: 42.9 Nutritional Status: BMI > 30  Obese Nutritional Risks: None Diabetes: No  How often do you need to have someone help you when you read instructions, pamphlets, or other written materials from your doctor or pharmacy?: 1 - Never  Diabetic? No  Interpreter Needed?: No  Information entered by :: Orrin Brigham LPN   Activities of Daily Living In your present state of  health, do you have any difficulty performing the following activities: 05/23/2021  Hearing? N  Vision? N  Difficulty concentrating or making decisions? N  Walking or climbing stairs? Y  Comment uses walker  Dressing or bathing? Y  Doing errands, shopping? Y  Comment wife drives  Conservation officer, nature and eating ? N  Using the Toilet? N  In the past six months, have you accidently leaked urine? N  Do you have problems with loss of bowel control? N  Managing your Medications? N  Managing your Finances? N  Housekeeping or managing your Housekeeping? N  Some recent data might be hidden    Patient Care Team: Ria Bush, MD as PCP - General (Family Medicine)  Indicate any recent Medical Services you may have received from other than [facility] providers in the past year (date may be approximate).     Assessment:   This is a routine wellness examination for Leodis.  Hearing/Vision screen Hearing Screening - Comments:: No issues  Vision Screening - Comments:: Last exam 2021, plans to make an appointment  Dietary issues and exercise activities discussed: Current Exercise Habits: The patient does not participate in regular exercise at present   Goals Addressed             This Visit's Progress    Patient Stated       Would like to maintain current rotuine       Depression Screen PHQ 2/9 Scores 05/23/2021 04/26/2020 01/20/2019 08/10/2017 06/30/2016 06/30/2016 11/14/2014  PHQ - 2 Score 0 1 0 0 4 4 2   PHQ- 9 Score - 1 - 0 10 10 9     Fall Risk Fall Risk  05/23/2021 04/26/2020 01/20/2019 08/10/2017 06/30/2016  Falls in the past year? 0 0 1 Yes No  Comment - - - fell after LOC -  Number falls in past yr: 0 - 1 1 -  Injury with Fall? 0 - 0 Yes -  Risk Factor Category  - - - - -  Risk for fall due to : No  Fall Risks - - - -  Risk for fall due to: Comment - - - - -  Follow up Falls prevention discussed - - - -    FALL RISK PREVENTION PERTAINING TO THE HOME:  Any stairs in or around the  home? Yes  If so, are there any without handrails? No  Home free of loose throw rugs in walkways, pet beds, electrical cords, etc? Yes  Adequate lighting in your home to reduce risk of falls? Yes   ASSISTIVE DEVICES UTILIZED TO PREVENT FALLS:  Life alert? Yes  Use of a cane, walker or w/c? Yes , walker Grab bars in the bathroom? No  Shower chair or bench in shower? No  Elevated toilet seat or a handicapped toilet? Yes   TIMED UP AND GO:  Was the test performed? No , visit completed over the phone.    Cognitive Function: Normal cognitive status assessed by this Nurse Health Advisor. No abnormalities found.   MMSE - Mini Mental State Exam 08/10/2017 06/30/2016  Orientation to time 5 5  Orientation to Place 5 5  Registration 3 3  Attention/ Calculation 0 0  Recall 3 3  Language- name 2 objects 0 0  Language- repeat 1 1  Language- follow 3 step command 3 3  Language- read & follow direction 0 0  Write a sentence 0 0  Copy design 0 0  Total score 20 20        Immunizations Immunization History  Administered Date(s) Administered   Fluad Quad(high Dose 65+) 04/26/2020   Influenza Split 02/24/2012   Influenza, High Dose Seasonal PF 04/09/2018, 03/17/2019   Influenza,inj,Quad PF,6+ Mos 02/12/2014, 06/19/2015, 03/19/2017   Influenza-Unspecified 04/15/2016   Janssen (J&J) SARS-COV-2 Vaccination 09/04/2019   Pneumococcal Conjugate-13 11/14/2014   Pneumococcal Polysaccharide-23 05/24/2012, 08/10/2017   Td 06/15/2006    TDAP status: Due, Education has been provided regarding the importance of this vaccine. Advised may receive this vaccine at local pharmacy or Health Dept. Aware to provide a copy of the vaccination record if obtained from local pharmacy or Health Dept. Verbalized acceptance and understanding.  Flu Vaccine status: Up to date Patient plans to provide updated vaccine information for chart  Pneumococcal vaccine status: Up to date  Covid-19 vaccine status:  Information provided on how to obtain vaccines.  Patient plans to provide updated vaccine information for chart  Qualifies for Shingles Vaccine? Yes   Zostavax completed No   Shingrix Completed?: No.    Education has been provided regarding the importance of this vaccine. Patient has been advised to call insurance company to determine out of pocket expense if they have not yet received this vaccine. Advised may also receive vaccine at local pharmacy or Health Dept. Verbalized acceptance and understanding.  Screening Tests Health Maintenance  Topic Date Due   Zoster Vaccines- Shingrix (1 of 2) Never done   TETANUS/TDAP  07/16/2016   COVID-19 Vaccine (2 - Janssen risk series) 10/02/2019   INFLUENZA VACCINE  01/13/2021   COLONOSCOPY (Pts 45-55yrs Insurance coverage will need to be confirmed)  01/11/2024   Pneumonia Vaccine 31+ Years old  Completed   Hepatitis C Screening  Completed   HPV VACCINES  Aged Out    Health Maintenance  Health Maintenance Due  Topic Date Due   Zoster Vaccines- Shingrix (1 of 2) Never done   TETANUS/TDAP  07/16/2016   COVID-19 Vaccine (2 - Janssen risk series) 10/02/2019   INFLUENZA VACCINE  01/13/2021    Colorectal cancer screening: Type  of screening: Colonoscopy. Completed 01/10/14. Repeat every 5 years  Lung Cancer Screening: (Low Dose CT Chest recommended if Age 53-80 years, 30 pack-year currently smoking OR have quit w/in 15years.) does not qualify.     Additional Screening:  Hepatitis C Screening: does qualify; Completed 11/14/14  Vision Screening: Recommended annual ophthalmology exams for early detection of glaucoma and other disorders of the eye. Is the patient up to date with their annual eye exam?  No , patient plans on making an appointment. Who is the provider or what is the name of the office in which the patient attends annual eye exams? Provider information not available   Dental Screening: Recommended annual dental exams for proper oral  hygiene  Community Resource Referral / Chronic Care Management: CRR required this visit?  No   CCM required this visit?  No      Plan:     I have personally reviewed and noted the following in the patient's chart:   Medical and social history Use of alcohol, tobacco or illicit drugs  Current medications and supplements including opioid prescriptions. Patient is not currently taking opioid prescriptions. Functional ability and status Nutritional status Physical activity Advanced directives List of other physicians Hospitalizations, surgeries, and ER visits in previous 12 months Vitals Screenings to include cognitive, depression, and falls Referrals and appointments  In addition, I have reviewed and discussed with patient certain preventive protocols, quality metrics, and best practice recommendations. A written personalized care plan for preventive services as well as general preventive health recommendations were provided to patient.   Due to this being a telephonic visit, the after visit summary with patients personalized plan was offered to patient via mail or my-chart.  Patient would like to access on my-chart.    Loma Messing, LPN   90/07/1113   Nurse Health Advisor  Nurse Notes: none

## 2021-05-21 ENCOUNTER — Other Ambulatory Visit: Payer: Self-pay | Admitting: Family Medicine

## 2021-05-23 ENCOUNTER — Ambulatory Visit (INDEPENDENT_AMBULATORY_CARE_PROVIDER_SITE_OTHER): Payer: Medicare PPO

## 2021-05-23 VITALS — Ht 68.0 in | Wt 282.0 lb

## 2021-05-23 DIAGNOSIS — Z0000 Encounter for general adult medical examination without abnormal findings: Secondary | ICD-10-CM | POA: Diagnosis not present

## 2021-05-23 NOTE — Patient Instructions (Signed)
Mr. Patrick Garner , Thank you for taking time to complete your Medicare Wellness Visit. I appreciate your ongoing commitment to your health goals. Please review the following plan we discussed and let me know if I can assist you in the future.   Screening recommendations/referrals: Colonoscopy: up to date, completed 01/10/14, due 01/11/24 Recommended yearly ophthalmology/optometry visit for glaucoma screening and checkup Recommended yearly dental visit for hygiene and checkup  Vaccinations: Influenza vaccine: up to date per our conversation, please call or bing updated vaccine information for your chart. Pneumococcal vaccine: up to date Tdap vaccine: Due-May obtain vaccine at your local pharmacy. Shingles vaccine: Discuss with your local pharmacy Covid-19: up to date per our conversation, please call or bing updated vaccine information for your chart.  Advanced directives: copy on file  Conditions/risks identified: see problem list  Next appointment: Follow up in one year for your annual wellness visit.   Preventive Care 71 Years and Older, Male Preventive care refers to lifestyle choices and visits with your health care provider that can promote health and wellness. What does preventive care include? A yearly physical exam. This is also called an annual well check. Dental exams once or twice a year. Routine eye exams. Ask your health care provider how often you should have your eyes checked. Personal lifestyle choices, including: Daily care of your teeth and gums. Regular physical activity. Eating a healthy diet. Avoiding tobacco and drug use. Limiting alcohol use. Practicing safe sex. Taking low doses of aspirin every day. Taking vitamin and mineral supplements as recommended by your health care provider. What happens during an annual well check? The services and screenings done by your health care provider during your annual well check will depend on your age, overall health, lifestyle  risk factors, and family history of disease. Counseling  Your health care provider may ask you questions about your: Alcohol use. Tobacco use. Drug use. Emotional well-being. Home and relationship well-being. Sexual activity. Eating habits. History of falls. Memory and ability to understand (cognition). Work and work Statistician. Screening  You may have the following tests or measurements: Height, weight, and BMI. Blood pressure. Lipid and cholesterol levels. These may be checked every 5 years, or more frequently if you are over 66 years old. Skin check. Lung cancer screening. You may have this screening every year starting at age 71 if you have a 30-pack-year history of smoking and currently smoke or have quit within the past 15 years. Fecal occult blood test (FOBT) of the stool. You may have this test every year starting at age 71. Flexible sigmoidoscopy or colonoscopy. You may have a sigmoidoscopy every 5 years or a colonoscopy every 10 years starting at age 71. Prostate cancer screening. Recommendations will vary depending on your family history and other risks. Hepatitis C blood test. Hepatitis B blood test. Sexually transmitted disease (STD) testing. Diabetes screening. This is done by checking your blood sugar (glucose) after you have not eaten for a while (fasting). You may have this done every 1-3 years. Abdominal aortic aneurysm (AAA) screening. You may need this if you are a current or former smoker. Osteoporosis. You may be screened starting at age 71 if you are at high risk. Talk with your health care provider about your test results, treatment options, and if necessary, the need for more tests. Vaccines  Your health care provider may recommend certain vaccines, such as: Influenza vaccine. This is recommended every year. Tetanus, diphtheria, and acellular pertussis (Tdap, Td) vaccine. You may need a  Td booster every 10 years. Zoster vaccine. You may need this after age  71. Pneumococcal 13-valent conjugate (PCV13) vaccine. One dose is recommended after age 16. Pneumococcal polysaccharide (PPSV23) vaccine. One dose is recommended after age 71. Talk to your health care provider about which screenings and vaccines you need and how often you need them. This information is not intended to replace advice given to you by your health care provider. Make sure you discuss any questions you have with your health care provider. Document Released: 06/28/2015 Document Revised: 02/19/2016 Document Reviewed: 04/02/2015 Elsevier Interactive Patient Education  2017 [employer] Prevention in the Home Falls can cause injuries. They can happen to people of all ages. There are many things you can do to make your home safe and to help prevent falls. What can I do on the outside of my home? Regularly fix the edges of walkways and driveways and fix any cracks. Remove anything that might make you trip as you walk through a door, such as a raised step or threshold. Trim any bushes or trees on the path to your home. Use bright outdoor lighting. Clear any walking paths of anything that might make someone trip, such as rocks or tools. Regularly check to see if handrails are loose or broken. Make sure that both sides of any steps have handrails. Any raised decks and porches should have guardrails on the edges. Have any leaves, snow, or ice cleared regularly. Use sand or salt on walking paths during winter. Clean up any spills in your garage right away. This includes oil or grease spills. What can I do in the bathroom? Use night lights. Install grab bars by the toilet and in the tub and shower. Do not use towel bars as grab bars. Use non-skid mats or decals in the tub or shower. If you need to sit down in the shower, use a plastic, non-slip stool. Keep the floor dry. Clean up any water that spills on the floor as soon as it happens. Remove soap buildup in the tub or shower  regularly. Attach bath mats securely with double-sided non-slip rug tape. Do not have throw rugs and other things on the floor that can make you trip. What can I do in the bedroom? Use night lights. Make sure that you have a light by your bed that is easy to reach. Do not use any sheets or blankets that are too big for your bed. They should not hang down onto the floor. Have a firm chair that has side arms. You can use this for support while you get dressed. Do not have throw rugs and other things on the floor that can make you trip. What can I do in the kitchen? Clean up any spills right away. Avoid walking on wet floors. Keep items that you use a lot in easy-to-reach places. If you need to reach something above you, use a strong step stool that has a grab bar. Keep electrical cords out of the way. Do not use floor polish or wax that makes floors slippery. If you must use wax, use non-skid floor wax. Do not have throw rugs and other things on the floor that can make you trip. What can I do with my stairs? Do not leave any items on the stairs. Make sure that there are handrails on both sides of the stairs and use them. Fix handrails that are broken or loose. Make sure that handrails are as long as the stairways. Check any  carpeting to make sure that it is firmly attached to the stairs. Fix any carpet that is loose or worn. Avoid having throw rugs at the top or bottom of the stairs. If you do have throw rugs, attach them to the floor with carpet tape. Make sure that you have a light switch at the top of the stairs and the bottom of the stairs. If you do not have them, ask someone to add them for you. What else can I do to help prevent falls? Wear shoes that: Do not have high heels. Have rubber bottoms. Are comfortable and fit you well. Are closed at the toe. Do not wear sandals. If you use a stepladder: Make sure that it is fully opened. Do not climb a closed stepladder. Make sure that  both sides of the stepladder are locked into place. Ask someone to hold it for you, if possible. Clearly mark and make sure that you can see: Any grab bars or handrails. First and last steps. Where the edge of each step is. Use tools that help you move around (mobility aids) if they are needed. These include: Canes. Walkers. Scooters. Crutches. Turn on the lights when you go into a dark area. Replace any light bulbs as soon as they burn out. Set up your furniture so you have a clear path. Avoid moving your furniture around. If any of your floors are uneven, fix them. If there are any pets around you, be aware of where they are. Review your medicines with your doctor. Some medicines can make you feel dizzy. This can increase your chance of falling. Ask your doctor what other things that you can do to help prevent falls. This information is not intended to replace advice given to you by your health care provider. Make sure you discuss any questions you have with your health care provider. Document Released: 03/28/2009 Document Revised: 11/07/2015 Document Reviewed: 07/06/2014 Elsevier Interactive Patient Education  2017 [employer].

## 2021-05-27 ENCOUNTER — Other Ambulatory Visit: Payer: Self-pay | Admitting: Family Medicine

## 2021-05-29 ENCOUNTER — Other Ambulatory Visit: Payer: Self-pay | Admitting: Family Medicine

## 2021-05-29 NOTE — Telephone Encounter (Signed)
Looks like this was printed yesterday; not sure if it was given to you to sign or not?

## 2021-05-30 NOTE — Telephone Encounter (Signed)
ERx 

## 2021-06-10 ENCOUNTER — Other Ambulatory Visit: Payer: Self-pay

## 2021-06-10 ENCOUNTER — Encounter: Payer: Self-pay | Admitting: Family Medicine

## 2021-06-10 ENCOUNTER — Other Ambulatory Visit: Payer: Medicare PPO

## 2021-06-10 ENCOUNTER — Telehealth: Payer: Self-pay | Admitting: Radiology

## 2021-06-10 ENCOUNTER — Ambulatory Visit (INDEPENDENT_AMBULATORY_CARE_PROVIDER_SITE_OTHER): Payer: Medicare PPO | Admitting: Family Medicine

## 2021-06-10 VITALS — BP 140/88 | HR 61 | Temp 97.9°F | Ht 67.5 in | Wt 281.2 lb

## 2021-06-10 DIAGNOSIS — Z125 Encounter for screening for malignant neoplasm of prostate: Secondary | ICD-10-CM | POA: Diagnosis not present

## 2021-06-10 DIAGNOSIS — Z87891 Personal history of nicotine dependence: Secondary | ICD-10-CM

## 2021-06-10 DIAGNOSIS — I480 Paroxysmal atrial fibrillation: Secondary | ICD-10-CM

## 2021-06-10 DIAGNOSIS — I2510 Atherosclerotic heart disease of native coronary artery without angina pectoris: Secondary | ICD-10-CM

## 2021-06-10 DIAGNOSIS — M545 Low back pain, unspecified: Secondary | ICD-10-CM

## 2021-06-10 DIAGNOSIS — R7303 Prediabetes: Secondary | ICD-10-CM

## 2021-06-10 DIAGNOSIS — Z95810 Presence of automatic (implantable) cardiac defibrillator: Secondary | ICD-10-CM

## 2021-06-10 DIAGNOSIS — H34832 Tributary (branch) retinal vein occlusion, left eye, with macular edema: Secondary | ICD-10-CM

## 2021-06-10 DIAGNOSIS — N138 Other obstructive and reflux uropathy: Secondary | ICD-10-CM

## 2021-06-10 DIAGNOSIS — D751 Secondary polycythemia: Secondary | ICD-10-CM

## 2021-06-10 DIAGNOSIS — G8929 Other chronic pain: Secondary | ICD-10-CM

## 2021-06-10 DIAGNOSIS — G4733 Obstructive sleep apnea (adult) (pediatric): Secondary | ICD-10-CM

## 2021-06-10 DIAGNOSIS — I5020 Unspecified systolic (congestive) heart failure: Secondary | ICD-10-CM

## 2021-06-10 DIAGNOSIS — Z0000 Encounter for general adult medical examination without abnormal findings: Secondary | ICD-10-CM | POA: Diagnosis not present

## 2021-06-10 DIAGNOSIS — N401 Enlarged prostate with lower urinary tract symptoms: Secondary | ICD-10-CM | POA: Diagnosis not present

## 2021-06-10 DIAGNOSIS — Z950 Presence of cardiac pacemaker: Secondary | ICD-10-CM

## 2021-06-10 DIAGNOSIS — R0989 Other specified symptoms and signs involving the circulatory and respiratory systems: Secondary | ICD-10-CM | POA: Insufficient documentation

## 2021-06-10 DIAGNOSIS — F39 Unspecified mood [affective] disorder: Secondary | ICD-10-CM

## 2021-06-10 DIAGNOSIS — I10 Essential (primary) hypertension: Secondary | ICD-10-CM

## 2021-06-10 DIAGNOSIS — E785 Hyperlipidemia, unspecified: Secondary | ICD-10-CM | POA: Diagnosis not present

## 2021-06-10 DIAGNOSIS — N1831 Chronic kidney disease, stage 3a: Secondary | ICD-10-CM | POA: Diagnosis not present

## 2021-06-10 DIAGNOSIS — G6289 Other specified polyneuropathies: Secondary | ICD-10-CM

## 2021-06-10 DIAGNOSIS — Z8719 Personal history of other diseases of the digestive system: Secondary | ICD-10-CM

## 2021-06-10 LAB — CBC WITH DIFFERENTIAL/PLATELET
Basophils Absolute: 0 10*3/uL (ref 0.0–0.1)
Basophils Relative: 0.6 % (ref 0.0–3.0)
Eosinophils Absolute: 0.3 10*3/uL (ref 0.0–0.7)
Eosinophils Relative: 4 % (ref 0.0–5.0)
HCT: 58.1 % — ABNORMAL HIGH (ref 39.0–52.0)
Hemoglobin: 18.9 g/dL (ref 13.0–17.0)
Lymphocytes Relative: 20.4 % (ref 12.0–46.0)
Lymphs Abs: 1.4 10*3/uL (ref 0.7–4.0)
MCHC: 32.5 g/dL (ref 30.0–36.0)
MCV: 89.4 fl (ref 78.0–100.0)
Monocytes Absolute: 0.4 10*3/uL (ref 0.1–1.0)
Monocytes Relative: 6.3 % (ref 3.0–12.0)
Neutro Abs: 4.7 10*3/uL (ref 1.4–7.7)
Neutrophils Relative %: 68.7 % (ref 43.0–77.0)
Platelets: 171 10*3/uL (ref 150.0–400.0)
RBC: 6.5 Mil/uL — ABNORMAL HIGH (ref 4.22–5.81)
RDW: 15.3 % (ref 11.5–15.5)
WBC: 6.9 10*3/uL (ref 4.0–10.5)

## 2021-06-10 LAB — LIPID PANEL
Cholesterol: 148 mg/dL (ref 0–200)
HDL: 42.9 mg/dL (ref >39.00)
LDL Cholesterol: 68 mg/dL (ref 0–99)
NonHDL: 104.85
Total CHOL/HDL Ratio: 3
Triglycerides: 186 mg/dL — ABNORMAL HIGH (ref 0.0–149.0)
VLDL: 37.2 mg/dL (ref 0.0–40.0)

## 2021-06-10 LAB — PSA: PSA: 0.21 ng/mL (ref 0.10–4.00)

## 2021-06-10 LAB — COMPREHENSIVE METABOLIC PANEL
ALT: 13 U/L (ref 0–53)
AST: 20 U/L (ref 0–37)
Albumin: 3.8 g/dL (ref 3.5–5.2)
Alkaline Phosphatase: 83 U/L (ref 39–117)
BUN: 13 mg/dL (ref 6–23)
CO2: 30 mEq/L (ref 19–32)
Calcium: 8.9 mg/dL (ref 8.4–10.5)
Chloride: 105 mEq/L (ref 96–112)
Creatinine, Ser: 1.08 mg/dL (ref 0.40–1.50)
GFR: 68.96 mL/min (ref >60.00)
Glucose, Bld: 98 mg/dL (ref 70–99)
Potassium: 3.9 mEq/L (ref 3.5–5.1)
Sodium: 146 mEq/L — ABNORMAL HIGH (ref 135–145)
Total Bilirubin: 2.5 mg/dL — ABNORMAL HIGH (ref 0.2–1.2)
Total Protein: 5.8 g/dL — ABNORMAL LOW (ref 6.0–8.3)

## 2021-06-10 LAB — HEMOGLOBIN A1C: Hgb A1c MFr Bld: 5.8 % (ref 4.6–6.5)

## 2021-06-10 LAB — VITAMIN D 25 HYDROXY (VIT D DEFICIENCY, FRACTURES): VITD: 35.67 ng/mL (ref 30.00–100.00)

## 2021-06-10 MED ORDER — PANTOPRAZOLE SODIUM 40 MG PO TBEC: 40.0000 mg | DELAYED_RELEASE_TABLET | Freq: Every day | ORAL | 3 refills | Status: AC

## 2021-06-10 MED ORDER — SERTRALINE HCL 50 MG PO TABS: ORAL_TABLET | ORAL | 3 refills | Status: DC

## 2021-06-10 MED ORDER — GABAPENTIN 300 MG PO CAPS
300.0000 mg | ORAL_CAPSULE | Freq: Two times a day (BID) | ORAL | 3 refills | Status: AC
Start: 2021-06-10 — End: ?

## 2021-06-10 MED ORDER — FINASTERIDE 5 MG PO TABS
5.0000 mg | ORAL_TABLET | Freq: Every day | ORAL | 3 refills | Status: AC
Start: 2021-06-10 — End: ?

## 2021-06-10 NOTE — Assessment & Plan Note (Signed)
Preventative protocols reviewed and updated unless pt declined. Discussed healthy diet and lifestyle.  

## 2021-06-10 NOTE — Assessment & Plan Note (Signed)
Remains abstinent.  Has declined lung cancer screen.

## 2021-06-10 NOTE — Assessment & Plan Note (Signed)
Continue regular eye doctor evaluation.

## 2021-06-10 NOTE — Assessment & Plan Note (Signed)
Chronic on high dose atorvastatin. Update FLP.  The 10-year ASCVD risk score (Arnett DK, et al., 2019) is: 27.8%   Values used to calculate the score:     Age: 70 years     Sex: Male     Is Non-Hispanic African American: No     Diabetic: No     Tobacco smoker: Yes     Systolic Blood Pressure: 500 mmHg     Is BP treated: Yes     HDL Cholesterol: 41.9 mg/dL     Total Cholesterol: 142 mg/dL

## 2021-06-10 NOTE — Assessment & Plan Note (Addendum)
Bilaterally diminished pulses likely due to PAD/arterial insufficiency, however pt asymptomatic. Reviewed symptoms of claudication to monitor for. He stays locally very limited amount of time (1-2 d/yr) - recommend he touch base with local [place] cardiology team to see if he would benefit from PAD evaluation if not already done.  Reviewed importance of routine foot care to ensure no unperceived foot wound develops.

## 2021-06-10 NOTE — Assessment & Plan Note (Signed)
Regularly sees cardiology in [place], recently started on entresto and farxiga and seems to be doing well.

## 2021-06-10 NOTE — Assessment & Plan Note (Signed)
Update A1c ?

## 2021-06-10 NOTE — Assessment & Plan Note (Signed)
Stable period on sertraline 50mg daily.  

## 2021-06-10 NOTE — Progress Notes (Signed)
Patient ID: Patrick Garner, male    DOB: [birth year], 70 y.o.   MRN: [id number]  This visit was conducted in person.  BP 140/88    Pulse 61    Temp 97.9 F (36.6 C) (Temporal)    Ht 5' 7.5" (1.715 m)    Wt 281 lb 4 oz (127.6 kg)    SpO2 96%    BMI 43.40 kg/m    CC: CPE/AMW Subjective:   HPI: Patrick Garner is a 70 y.o. male presenting on 06/10/2021 for Annual Exam [facility] prt 2.  Pt accompanied by wife, Patrick Garner. )   Saw health advisor earlier this month for medicare wellness visit. Note reviewed.    No results found.  Flowsheet Row Clinical Support from 05/23/2021 in [place] at [place]  PHQ-2 Total Score 0       Fall Risk  05/23/2021 04/26/2020 01/20/2019 08/10/2017 06/30/2016  Falls in the past year? 0 0 1 Yes No  Comment - - - fell after LOC -  Number falls in past yr: 0 - 1 1 -  Injury with Fall? 0 - 0 Yes -  Risk Factor Category  - - - - -  Risk for fall due to : No Fall Risks - - - -  Risk for fall due to: Comment - - - - -  Follow up Falls prevention discussed - - - -    Known PAF, sCHF, diffuse CAD with cardiomyopathy and pacemaker. H/o GI bleed s/p colonoscopy - doesn't want another. On xarelto due to high stroke risk. Takes PPI once daily. Sees cardiologist in [place] (Dr Heath Lark and Celene Skeen PA). No records have been received.   Requests labs sent to cardiology attn Celene Skeen (ph [phone]) at [facility] in [place].   Preventative: Colon cancer screening - positive stool kit, states he had normal EGD and 10 polyps removed from colonoscopy 12/2013 by Dr Christian Mate (in [place]). Declines repeat, declines iFOB. No blood in stool. Declines discussing colonoscopy.  Prostate cancer screening - pt does not want further prostate exams. Will continue PSA in h/o BPH on finasteride  Lung cancer screening - 45 PY hx, eligible but declines.  Flu shot 03/2016  COVID vaccine J&J 08/2019, Moderna booster 05/2020, Pfizer bivalent 03/2021  Pneumovax -  05/2012. Prevnar 11/2014. pneumovax-23 2019 Tetanus 2008.  Shingrix - discussed - will check with pharmacy about this.  Advanced directive scanned 12/2014. Wife Patrick Garner is HCPOA. No prolonged life support if terminal condition. No tube feeds.  Seat belt use discussed - doesn't wear seat belt, declines Sunscreen use discussed. No changing moles on skin. Ex smoker - quit 08/2013, averaged 1 ppd  Alcohol - none  Dentist - new upper dentures  Eye exam - yearly  Bowel - no constipation - loose stools with certain food. Occasional imodium QOD.  Bladder - no incontinence   Caffeine: rare   Lives with wife, 1 cat   Live in [place], commutes for medical care. 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     Relevant past medical, surgical, family and social history reviewed and updated as indicated. Interim medical history since our last visit reviewed. Allergies and medications reviewed and updated. Outpatient Medications Prior to Visit  Medication Sig Dispense Refill   atorvastatin (LIPITOR) 80 MG tablet Take 80 mg  by mouth daily.     dapagliflozin propanediol (FARXIGA) 10 MG TABS tablet Take 1 tablet (10 mg total) by mouth daily before breakfast.     diclofenac Sodium (VOLTAREN) 1 % GEL APPLY 2 GRAMS TOPICALLY THREE TIMES A DAY 100 g 3   Multiple Vitamin (MULTIVITAMIN) tablet Take 1 tablet by mouth daily.     rivaroxaban (XARELTO) 20 MG TABS tablet Take 20 mg by mouth daily with supper.     sacubitril-valsartan (ENTRESTO) 49-51 MG Take 1 tablet by mouth 2 (two) times daily.     finasteride (PROSCAR) 5 MG tablet Take 1 tablet (5 mg total) by mouth daily. 90 tablet 3   furosemide (LASIX) 40 MG tablet Take 1 tablet (40 mg total) by mouth daily.     gabapentin (NEURONTIN) 300 MG capsule TAKE ONE CAPSULE BY MOUTH TWO TIMES A DAY 180 capsule 0    KLOR-CON M20 20 MEQ tablet TAKE ONE TABLET BY MOUTH DAILY 90 tablet 0   lisinopril (ZESTRIL) 20 MG tablet Take 1 tablet (20 mg total) by mouth daily.     mupirocin ointment (BACTROBAN) 2 % Apply 1 application topically 2 (two) times daily. 22 g 0   pantoprazole (PROTONIX) 40 MG tablet TAKE ONE TABLET BY MOUTH TWICE A DAY 180 tablet 3   sertraline (ZOLOFT) 50 MG tablet TAKE ONE TABLET BY MOUTH DAILY TAKE AS DIRECTED 90 tablet 0   sotalol (BETAPACE) 120 MG tablet Take 120 mg by mouth 2 (two) times daily. Takes 1/2 tablet 2 times daily     furosemide (LASIX) 40 MG tablet Take 1 tablet (40 mg total) by mouth daily as needed.     potassium chloride SA (KLOR-CON M20) 20 MEQ tablet Take 1 tablet (20 mEq total) by mouth daily as needed (with lasix).     sotalol (BETAPACE) 120 MG tablet Take 0.5 tablets (60 mg total) by mouth 2 (two) times daily.     No facility-administered medications prior to visit.     Per HPI unless specifically indicated in ROS section below Review of Systems  Constitutional:  Negative for activity change, appetite change, chills, fatigue, fever and unexpected weight change.  HENT:  Negative for hearing loss.   Eyes:  Negative for visual disturbance.  Respiratory:  Negative for cough, chest tightness, shortness of breath and wheezing.   Cardiovascular:  Negative for chest pain, palpitations and leg swelling.  Gastrointestinal:  Negative for abdominal distention, abdominal pain, blood in stool, constipation, diarrhea, nausea and vomiting.  Genitourinary:  Negative for difficulty urinating and hematuria.  Musculoskeletal:  Negative for arthralgias, myalgias and neck pain.  Skin:  Negative for rash.  Neurological:  Negative for dizziness, seizures, syncope and headaches.  Hematological:  Negative for adenopathy. Bruises/bleeds easily.  Psychiatric/Behavioral:  Negative for dysphoric mood. The patient is not nervous/anxious.    Objective:  BP 140/88    Pulse 61    Temp 97.9 F  (36.6 C) (Temporal)    Ht 5' 7.5" (1.715 m)    Wt 281 lb 4 oz (127.6 kg)    SpO2 96%    BMI 43.40 kg/m   Wt Readings from Last 3 Encounters:  06/10/21 281 lb 4 oz (127.6 kg)  05/23/21 282 lb (127.9 kg)  04/26/20 282 lb 1 oz (127.9 kg)      Physical Exam Vitals and nursing note reviewed.  Constitutional:      General: He is not in acute distress.    Appearance: Normal appearance. He is well-developed. He  is not ill-appearing.  HENT:     Head: Normocephalic and atraumatic.     Right Ear: Hearing, tympanic membrane, ear canal and external ear normal.     Left Ear: Hearing, tympanic membrane, ear canal and external ear normal.  Eyes:     General: No scleral icterus.    Extraocular Movements: Extraocular movements intact.     Conjunctiva/sclera: Conjunctivae normal.     Pupils: Pupils are equal, round, and reactive to light.  Neck:     Thyroid: No thyroid mass or thyromegaly.     Vascular: No carotid bruit.  Cardiovascular:     Rate and Rhythm: Normal rate and regular rhythm.     Pulses: Normal pulses.          Radial pulses are 2+ on the right side and 2+ on the left side.     Heart sounds: Normal heart sounds. No murmur heard. Pulmonary:     Effort: Pulmonary effort is normal. No respiratory distress.     Breath sounds: Normal breath sounds. No wheezing, rhonchi or rales.  Abdominal:     General: Bowel sounds are normal. There is no distension.     Palpations: Abdomen is soft. There is no mass.     Tenderness: There is no abdominal tenderness. There is no guarding or rebound.     Hernia: No hernia is present.  Musculoskeletal:        General: Normal range of motion.     Cervical back: Normal range of motion and neck supple.     Right lower leg: No edema.     Left lower leg: No edema.     Comments: Diminished pedal pulses  Lymphadenopathy:     Cervical: No cervical adenopathy.  Skin:    General: Skin is cool and dry.     Findings: Rash present.     Comments:  BLE stay  cold and with diminished sensation Erythematous scaly rash anterior left lower leg at shin   Neurological:     General: No focal deficit present.     Mental Status: He is alert and oriented to person, place, and time.  Psychiatric:        Mood and Affect: Mood normal.        Behavior: Behavior normal.        Thought Content: Thought content normal.        Judgment: Judgment normal.      Results for orders placed or performed in visit on 04/26/20  Lipid panel  Result Value Ref Range   Cholesterol 142 0 - 200 mg/dL   Triglycerides 187.0 (H) 0.0 - 149.0 mg/dL   HDL 41.90 >39.00 mg/dL   VLDL 37.4 0.0 - 40.0 mg/dL   LDL Cholesterol 62 0 - 99 mg/dL   Total CHOL/HDL Ratio 3    NonHDL 99.77   Comprehensive metabolic panel  Result Value Ref Range   Sodium 144 135 - 145 mEq/L   Potassium 3.7 3.5 - 5.1 mEq/L   Chloride 106 96 - 112 mEq/L   CO2 27 19 - 32 mEq/L   Glucose, Bld 97 70 - 99 mg/dL   BUN 10 6 - 23 mg/dL   Creatinine, Ser 1.04 0.40 - 1.50 mg/dL   Total Bilirubin 2.1 (H) 0.2 - 1.2 mg/dL   Alkaline Phosphatase 101 39 - 117 U/L   AST 19 0 - 37 U/L   ALT 13 0 - 53 U/L   Total Protein 5.9 (L) 6.0 - 8.3  g/dL   Albumin 4.0 3.5 - 5.2 g/dL   GFR 72.73 >60.00 mL/min   Calcium 8.6 8.4 - 10.5 mg/dL  Hemoglobin A1c  Result Value Ref Range   Hgb A1c MFr Bld 5.7 4.6 - 6.5 %  PSA  Result Value Ref Range   PSA 0.14 0.10 - 4.00 ng/mL  CBC with Differential/Platelet  Result Value Ref Range   WBC 7.6 4.0 - 10.5 K/uL   RBC 6.11 (H) 4.22 - 5.81 Mil/uL   Hemoglobin 17.9 (H) 13.0 - 17.0 g/dL   HCT 52.7 (H) 39.0 - 52.0 %   MCV 86.3 78.0 - 100.0 fl   MCHC 34.0 30.0 - 36.0 g/dL   RDW 14.6 11.5 - 15.5 %   Platelets 154.0 150.0 - 400.0 K/uL   Neutrophils Relative % 71.0 43.0 - 77.0 %   Lymphocytes Relative 18.4 12.0 - 46.0 %   Monocytes Relative 5.3 3.0 - 12.0 %   Eosinophils Relative 4.9 0.0 - 5.0 %   Basophils Relative 0.4 0.0 - 3.0 %   Neutro Abs 5.4 1.4 - 7.7 K/uL   Lymphs Abs 1.4  0.7 - 4.0 K/uL   Monocytes Absolute 0.4 0.1 - 1.0 K/uL   Eosinophils Absolute 0.4 0.0 - 0.7 K/uL   Basophils Absolute 0.0 0.0 - 0.1 K/uL  VITAMIN D 25 Hydroxy (Vit-D Deficiency, Fractures)  Result Value Ref Range   VITD 32.48 30.00 - 100.00 ng/mL    Assessment & Plan:  This visit occurred during the SARS-CoV-2 public health emergency.  Safety protocols were in place, including screening questions prior to the visit, additional usage of staff PPE, and extensive cleaning of exam room while observing appropriate contact time as indicated for disinfecting solutions.   Problem List Items Addressed This Visit     Health maintenance examination - Primary (Chronic)    Preventative protocols reviewed and updated unless pt declined. Discussed healthy diet and lifestyle.       HTN (hypertension)    Chronic, stable on current regimen - continue.       Relevant Medications   furosemide (LASIX) 40 MG tablet   sacubitril-valsartan (ENTRESTO) 49-51 MG   sotalol (BETAPACE) 120 MG tablet   HLD (hyperlipidemia)    Chronic on high dose atorvastatin. Update FLP.  The 10-year ASCVD risk score (Arnett DK, et al., 2019) is: 27.8%   Values used to calculate the score:     Age: 18 years     Sex: Male     Is Non-Hispanic African American: No     Diabetic: No     Tobacco smoker: Yes     Systolic Blood Pressure: 704 mmHg     Is BP treated: Yes     HDL Cholesterol: 41.9 mg/dL     Total Cholesterol: 142 mg/dL       Relevant Medications   furosemide (LASIX) 40 MG tablet   sacubitril-valsartan (ENTRESTO) 49-51 MG   sotalol (BETAPACE) 120 MG tablet   Other Relevant Orders   Lipid panel   Comprehensive metabolic panel   Chronic back pain    Continue low dose gabapentin 367m BID which he tolerates well.       Relevant Medications   gabapentin (NEURONTIN) 300 MG capsule   sertraline (ZOLOFT) 50 MG tablet   Peripheral neuropathy    Continue gabapentin. No longer on amitriptyline.        Relevant Medications   gabapentin (NEURONTIN) 300 MG capsule   sertraline (ZOLOFT) 50 MG tablet   OSA (  obstructive sleep apnea)    Has declined CPAP or sleep eval.       Benign prostatic hyperplasia with urinary obstruction    Continue yearly PSA on finasteride.      Relevant Medications   finasteride (PROSCAR) 5 MG tablet   Other Relevant Orders   PSA   Ex-smoker    Remains abstinent.  Has declined lung cancer screen.      Severe obesity (BMI >= 40) (HCC)    Encouraged healthy diet and lifestyle.       Relevant Medications   dapagliflozin propanediol (FARXIGA) 10 MG TABS tablet   HFrEF (heart failure with reduced ejection fraction) [facility])    Regularly sees cardiology in [place], recently started on [place] and seems to be doing well.       Relevant Medications   furosemide (LASIX) 40 MG tablet   sacubitril-valsartan (ENTRESTO) 49-51 MG   sotalol (BETAPACE) 120 MG tablet   Status post placement of cardiac pacemaker   CAD (coronary artery disease)   Relevant Medications   furosemide (LASIX) 40 MG tablet   sacubitril-valsartan (ENTRESTO) 49-51 MG   sotalol (BETAPACE) 120 MG tablet   History of GI bleed    Denies recurrent blood in stool.       Paroxysmal atrial fibrillation (HCC)    Continue xarelto use.       Relevant Medications   furosemide (LASIX) 40 MG tablet   sacubitril-valsartan (ENTRESTO) 49-51 MG   sotalol (BETAPACE) 120 MG tablet   AICD (automatic cardioverter/defibrillator) present   Prediabetes    Update A1c.      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       Relevant Medications   furosemide (LASIX) 40 MG tablet   sacubitril-valsartan (ENTRESTO) 49-51 MG   sotalol (BETAPACE) 120 MG tablet   Mood disorder (HCC)    Stable period on sertraline 45m daily.       Chronic kidney disease, stage 3a ([facility]     Update renal function.       Relevant Orders   VITAMIN D 25 Hydroxy (Vit-D Deficiency, Fractures)   Comprehensive metabolic panel   CBC with Differential/Platelet   Polycythemia    Update CBC, add periph smear.  Anticipate realted to untreated OSA.       Relevant Orders   Pathologist smear review   Diminished pulses in lower extremity    Bilaterally diminished pulses likely due to PAD/arterial insufficiency, however pt asymptomatic. Reviewed symptoms of claudication to monitor for. He stays locally very limited amount of time (1-2 d/yr) - [place] to see if he would benefit from PAD evaluation if not already done.  Reviewed importance of routine foot care to ensure no unperceived foot wound develops.       Other Visit Diagnoses     Special screening for malignant neoplasm of prostate       Relevant Orders   PSA        Meds ordered this encounter  Medications   pantoprazole (PROTONIX) 40 MG tablet    Sig: Take 1 tablet (40 mg total) by mouth daily.    Dispense:  90 tablet    Refill:  3   finasteride (PROSCAR) 5 MG tablet    Sig: Take 1 tablet (5 mg total) by mouth daily.  Dispense:  90 tablet    Refill:  3   gabapentin (NEURONTIN) 300 MG capsule    Sig: Take 1 capsule (300 mg total) by mouth 2 (two) times daily.    Dispense:  180 capsule    Refill:  3   sertraline (ZOLOFT) 50 MG tablet    Sig: TAKE ONE TABLET BY MOUTH DAILY TAKE AS DIRECTED    Dispense:  90 tablet    Refill:  3   Orders Placed This Encounter  Procedures   VITAMIN D 25 Hydroxy (Vit-D Deficiency, Fractures)   Lipid panel   Comprehensive metabolic panel   Hemoglobin A1c   PSA   CBC with Differential/Platelet   Pathologist smear review     Patient instructions: Labs today  If interested, check with pharmacy about new 2 shot shingles series (shingrix).  You have diminished pulses in legs. Let us or cardiology know right away if you have  any calf pain with walking or new sores to the legs/feet or if skin turns blue. Talk with cardiology about evaluation for arterial disease of lower legs.  Good to see you today.  Return as needed or in 1 year for next physical.   Follow up plan: Return in about 1 year (around 06/10/2022) for annual exam, prior fasting for blood work, medicare wellness visit.  Ria Bush, MD

## 2021-06-10 NOTE — Patient Instructions (Addendum)
Labs today  If interested, check with pharmacy about new 2 shot shingles series (shingrix).  You have diminished pulses in legs. Let us or cardiology know right away if you have any calf pain with walking or new sores to the legs/feet or if skin turns blue. Talk with cardiology about evaluation for arterial disease of lower legs.  Good to see you today.  Return as needed or in 1 year for next physical.   Health Maintenance After Age 71 After age 81, you are at a higher risk for certain long-term diseases and infections as well as injuries from falls. Falls are a major cause of broken bones and head injuries in people who are older than age 80. Getting regular preventive care can help to keep you healthy and well. Preventive care includes getting regular testing and making lifestyle changes as recommended by your health care provider. Talk with your health care provider about: Which screenings and tests you should have. A screening is a test that checks for a disease when you have no symptoms. A diet and exercise plan that is right for you. What should I know about screenings and tests to prevent falls? Screening and testing are the best ways to find a health problem early. Early diagnosis and treatment give you the best chance of managing medical conditions that are common after age 56. Certain conditions and lifestyle choices may make you more likely to have a fall. Your health care provider may recommend: Regular vision checks. Poor vision and conditions such as cataracts can make you more likely to have a fall. If you wear glasses, make sure to get your prescription updated if your vision changes. Medicine review. Work with your health care provider to regularly review all of the medicines you are taking, including over-the-counter medicines. Ask your health care provider about any side effects that may make you more likely to have a fall. Tell your health care provider if any medicines that you take  make you feel dizzy or sleepy. Strength and balance checks. Your health care provider may recommend certain tests to check your strength and balance while standing, walking, or changing positions. Foot health exam. Foot pain and numbness, as well as not wearing proper footwear, can make you more likely to have a fall. Screenings, including: Osteoporosis screening. Osteoporosis is a condition that causes the bones to get weaker and break more easily. Blood pressure screening. Blood pressure changes and medicines to control blood pressure can make you feel dizzy. Depression screening. You may be more likely to have a fall if you have a fear of falling, feel depressed, or feel unable to do activities that you used to do. Alcohol use screening. Using too much alcohol can affect your balance and may make you more likely to have a fall. Follow these instructions at home: Lifestyle Do not drink alcohol if: Your health care provider tells you not to drink. If you drink alcohol: Limit how much you have to: 0-1 drink a day for women. 0-2 drinks a day for men. Know how much alcohol is in your drink. In the [place] one drink equals one 12 oz bottle of beer (355 mL), one 5 oz glass of wine (148 mL), or one 1 oz glass of hard liquor (44 mL). Do not use any products that contain nicotine or tobacco. These products include cigarettes, chewing tobacco, and vaping devices, such as e-cigarettes. If you need help quitting, ask your health care provider. Activity  Follow a regular  exercise program to stay fit. This will help you maintain your balance. Ask your health care provider what types of exercise are appropriate for you. If you need a cane or walker, use it as recommended by your health care provider. Wear supportive shoes that have nonskid soles. Safety  Remove any tripping hazards, such as rugs, cords, and clutter. Install safety equipment such as grab bars in bathrooms and safety rails on stairs. Keep  rooms and walkways well-lit. General instructions Talk with your health care provider about your risks for falling. Tell your health care provider if: You fall. Be sure to tell your health care provider about all falls, even ones that seem minor. You feel dizzy, tiredness (fatigue), or off-balance. Take over-the-counter and prescription medicines only as told by your health care provider. These include supplements. Eat a healthy diet and maintain a healthy weight. A healthy diet includes low-fat dairy products, low-fat (lean) meats, and fiber from whole grains, beans, and lots of fruits and vegetables. Stay current with your vaccines. Schedule regular health, dental, and eye exams. Summary Having a healthy lifestyle and getting preventive care can help to protect your health and wellness after age 40. Screening and testing are the best way to find a health problem early and help you avoid having a fall. Early diagnosis and treatment give you the best chance for managing medical conditions that are more common for people who are older than age 36. Falls are a major cause of broken bones and head injuries in people who are older than age 70. Take precautions to prevent a fall at home. Work with your health care provider to learn what changes you can make to improve your health and wellness and to prevent falls. This information is not intended to replace advice given to you by your health care provider. Make sure you discuss any questions you have with your health care provider. Document Revised: 10/21/2020 Document Reviewed: 10/21/2020 Elsevier Patient Education  [employer].

## 2021-06-10 NOTE — Assessment & Plan Note (Signed)
Update renal function.  

## 2021-06-10 NOTE — Assessment & Plan Note (Signed)
Continue xarelto use.

## 2021-06-10 NOTE — Assessment & Plan Note (Signed)
Continue gabapentin. No longer on amitriptyline.

## 2021-06-10 NOTE — Assessment & Plan Note (Addendum)
Update CBC, add periph smear.  Anticipate realted to untreated OSA.

## 2021-06-10 NOTE — Assessment & Plan Note (Signed)
Denies recurrent blood in stool.

## 2021-06-10 NOTE — Assessment & Plan Note (Addendum)
Chronic, stable on current regimen - continue. 

## 2021-06-10 NOTE — Telephone Encounter (Signed)
Sending periph smear, Jak2 mutation, epo levels.

## 2021-06-10 NOTE — Telephone Encounter (Signed)
[facility] called a critical HGB - 18.9, results given to Dr Danise Mina

## 2021-06-10 NOTE — Assessment & Plan Note (Signed)
Continue low dose gabapentin 300mg  BID which he tolerates well.

## 2021-06-10 NOTE — Assessment & Plan Note (Signed)
Encouraged healthy diet and lifestyle.  

## 2021-06-10 NOTE — Assessment & Plan Note (Signed)
Continue yearly PSA on finasteride.

## 2021-06-10 NOTE — Assessment & Plan Note (Signed)
Has declined CPAP or sleep eval.

## 2021-06-11 LAB — PATHOLOGIST SMEAR REVIEW

## 2021-06-12 ENCOUNTER — Telehealth: Payer: Self-pay | Admitting: Family Medicine

## 2021-06-12 LAB — ERYTHROPOIETIN: Erythropoietin: 26.5 m[IU]/mL — ABNORMAL HIGH (ref 2.6–18.5)

## 2021-06-12 NOTE — Telephone Encounter (Signed)
Spoke with pt/pt's wife, Jana Half, relaying lab results (see Labs Result Notes, 06/10/21) and notifying him results were mailed and Dr. Darnell Level sent a MyChart message.  Verbalizes understanding.

## 2021-06-12 NOTE — Telephone Encounter (Signed)
Pt wife returning your call. 

## 2021-06-17 ENCOUNTER — Encounter: Payer: Self-pay | Admitting: Family Medicine

## 2021-06-18 LAB — JAK2 EXONS 12-15

## 2021-06-18 LAB — JAK2  V617F QUAL. WITH REFLEX TO EXON 12

## 2021-08-25 ENCOUNTER — Other Ambulatory Visit: Payer: Self-pay | Admitting: Family Medicine

## 2021-10-13 ENCOUNTER — Other Ambulatory Visit: Payer: Self-pay | Admitting: Family Medicine

## 2021-10-14 NOTE — Telephone Encounter (Signed)
Refill request Potassium chloride ?Last office visit 06/10/21 directions changes to as needed which does not match the request ?Upcoming appointment 06/23/22 ?Request is from [facility] ?

## 2022-01-13 ENCOUNTER — Telehealth: Payer: Self-pay | Admitting: Family Medicine

## 2022-01-13 NOTE — Telephone Encounter (Signed)
Patient wife called in and stated that Patrick Garner passed away on 12/25/2021. Just wanted to let Dr. Danise Mina aware. Thank you!

## 2022-01-16 NOTE — Telephone Encounter (Signed)
Spoke with wife, expressed my condolences.  

## 2022-06-16 ENCOUNTER — Ambulatory Visit: Payer: Medicare PPO

## 2022-06-23 ENCOUNTER — Encounter: Payer: Medicare PPO | Admitting: Family Medicine
